# Patient Record
Sex: Female | Born: 1975 | Race: White | Hispanic: No | Marital: Married | Smoking: Former smoker
Health system: Southern US, Community
[De-identification: ages and names within clinical notes are randomized; demographics above are authoritative.]

## PROBLEM LIST (undated history)

## (undated) DIAGNOSIS — C801 Malignant (primary) neoplasm, unspecified: Secondary | ICD-10-CM

## (undated) HISTORY — PX: TONSILLECTOMY AND ADENOIDECTOMY: SHX28

---
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
## 2004-01-01 HISTORY — PX: ABDOMINAL HYSTERECTOMY: SHX81

## 2011-12-13 ENCOUNTER — Ambulatory Visit: Payer: Self-pay | Admitting: Family Medicine

## 2013-07-07 ENCOUNTER — Ambulatory Visit: Payer: Self-pay | Admitting: Family Medicine

## 2014-10-19 ENCOUNTER — Ambulatory Visit: Payer: Self-pay | Admitting: Family Medicine

## 2014-11-02 ENCOUNTER — Ambulatory Visit: Payer: Self-pay | Admitting: Family Medicine

## 2015-01-12 ENCOUNTER — Ambulatory Visit: Payer: Self-pay | Admitting: Family Medicine

## 2015-01-12 LAB — TSH: THYROID STIMULATING HORM: 5.5 u[IU]/mL — AB

## 2015-06-07 ENCOUNTER — Other Ambulatory Visit: Payer: Self-pay | Admitting: Family Medicine

## 2015-06-07 DIAGNOSIS — E032 Hypothyroidism due to medicaments and other exogenous substances: Secondary | ICD-10-CM

## 2015-06-15 DIAGNOSIS — E039 Hypothyroidism, unspecified: Secondary | ICD-10-CM | POA: Insufficient documentation

## 2015-06-15 DIAGNOSIS — I6350 Cerebral infarction due to unspecified occlusion or stenosis of unspecified cerebral artery: Secondary | ICD-10-CM | POA: Insufficient documentation

## 2015-06-15 DIAGNOSIS — C539 Malignant neoplasm of cervix uteri, unspecified: Secondary | ICD-10-CM | POA: Insufficient documentation

## 2015-06-15 DIAGNOSIS — G43909 Migraine, unspecified, not intractable, without status migrainosus: Secondary | ICD-10-CM | POA: Insufficient documentation

## 2015-07-10 ENCOUNTER — Other Ambulatory Visit: Payer: Self-pay | Admitting: Family Medicine

## 2015-07-11 ENCOUNTER — Other Ambulatory Visit: Payer: Self-pay | Admitting: Family Medicine

## 2015-08-26 ENCOUNTER — Ambulatory Visit: Payer: Self-pay | Admitting: Family Medicine

## 2015-08-30 ENCOUNTER — Ambulatory Visit (INDEPENDENT_AMBULATORY_CARE_PROVIDER_SITE_OTHER): Payer: BC Managed Care – PPO | Admitting: Family Medicine

## 2015-08-30 ENCOUNTER — Encounter: Payer: Self-pay | Admitting: Family Medicine

## 2015-08-30 ENCOUNTER — Other Ambulatory Visit: Payer: Self-pay

## 2015-08-30 VITALS — BP 122/76 | HR 94 | Temp 98.5°F | Resp 16 | Wt 229.8 lb

## 2015-08-30 DIAGNOSIS — E039 Hypothyroidism, unspecified: Secondary | ICD-10-CM

## 2015-08-30 NOTE — Progress Notes (Signed)
Patient ID: Linda Rogers, female   DOB: 05/12/1976, 39 y.o.   MRN: [id number]  Chief Complaint  Patient presents with  . Follow-up    6 month follow up hypothyroidism   Subjective:  HPI  Hypothyroidism:  Still on the Levothyroxine 50 mcg qd. Denies heat or cold intolerance, palpitations, constipation, diarrhea, weight gain or hair loss. Feels she has had some puffiness or tightness sensation in low legs. Extra puffiness with walking or standing a lot.   Family History  Problem Relation Age of Onset  . Ovarian cancer Mother   . Colon polyps Mother    Past Surgical History  Procedure Laterality Date  . Abdominal hysterectomy  2005  . Appendectomy  1987  . Tonsillectomy and adenoidectomy      Prior to Admission medications   Medication Sig Start Date End Date Taking? Authorizing Provider  ibuprofen (ADVIL,MOTRIN) 200 MG tablet Take by mouth as needed.   Yes Historical Provider, MD  levothyroxine (SYNTHROID, LEVOTHROID) 50 MCG tablet TAKE 1 TABLET BY MOUTH EVERY DAY 07/11/15  Yes Margo Common, PA    Patient Active Problem List   Diagnosis Date Noted  . Malignant neoplasm of cervix uteri 06/15/2015  . Cerebral artery occlusion with cerebral infarction 06/15/2015  . Adult hypothyroidism 06/15/2015  . Migraine 06/15/2015   History reviewed. No pertinent past medical history.  Social History   Social History  . Marital Status: Single    Spouse Name: N/A  . Number of Children: N/A  . Years of Education: N/A   Occupational History  . Not on file.   Social History Main Topics  . Smoking status: Former Smoker    Quit date: 11/16/2014  . Smokeless tobacco: Not on file  . Alcohol Use: No  . Drug Use: No  . Sexual Activity: Not on file   Other Topics Concern  . Not on file   Social History Narrative   No Known Allergies  Review of Systems  Constitutional: Negative.   HENT: Negative.   Eyes: Negative.   Respiratory: Negative.   Cardiovascular: Negative.    Gastrointestinal: Negative.   Genitourinary: Negative.   Musculoskeletal: Negative.   Skin: Negative.   Neurological: Negative.      There is no immunization history on file for this patient. Objective:  BP 122/76 mmHg  Pulse 94  Temp(Src) 98.5 F (36.9 C) (Oral)  Resp 16  Wt 229 lb 12.8 oz (104.237 kg)  SpO2 98%  Physical Exam  Constitutional: She is well-developed, well-nourished, and in no distress.  HENT:  Head: Normocephalic and atraumatic.  Eyes: Conjunctivae and EOM are normal.  Neck: Normal range of motion. Neck supple. No thyromegaly present.  Cardiovascular: Normal rate, regular rhythm and normal heart sounds.   Pulmonary/Chest: Effort normal and breath sounds normal.  Abdominal: Soft. Bowel sounds are normal.  Musculoskeletal: Normal range of motion.  Neurological: She is alert.  Skin: Skin is warm and dry.  Psychiatric: Affect and judgment normal.   Assessment and Plan :  1. Hypothyroidism, unspecified hypothyroidism type Some puffiness in lower legs without pitting. Tolerating Levothyroxine without weight gain or constipation. Will recheck labs and follow up pending lab reports. - TSH - T4 - CBC with Differential/Platelet - COMPLETE METABOLIC PANEL WITH GFR   [facility] 08/30/2015 3:54 PM

## 2015-09-08 ENCOUNTER — Other Ambulatory Visit
Admission: RE | Admit: 2015-09-08 | Discharge: 2015-09-08 | Disposition: A | Discharge status: Home / Self Care | Payer: BC Managed Care – PPO | Source: Ambulatory Visit | Attending: Family Medicine | Admitting: Family Medicine

## 2015-09-08 DIAGNOSIS — E039 Hypothyroidism, unspecified: Secondary | ICD-10-CM | POA: Diagnosis present

## 2015-09-08 LAB — CBC WITH DIFFERENTIAL/PLATELET
BASOS PCT: 1 %
Basophils Absolute: 0.1 10*3/uL (ref 0–0.1)
EOS ABS: 0.1 10*3/uL (ref 0–0.7)
Eosinophils Relative: 1 %
HEMATOCRIT: 39.1 % (ref 35.0–47.0)
HEMOGLOBIN: 13.2 g/dL (ref 12.0–16.0)
LYMPHS ABS: 3.3 10*3/uL (ref 1.0–3.6)
Lymphocytes Relative: 34 %
MCH: 31.9 pg (ref 26.0–34.0)
MCHC: 33.6 g/dL (ref 32.0–36.0)
MCV: 94.9 fL (ref 80.0–100.0)
MONO ABS: 0.4 10*3/uL (ref 0.2–0.9)
MONOS PCT: 4 %
NEUTROS PCT: 60 %
Neutro Abs: 5.8 10*3/uL (ref 1.4–6.5)
Platelets: 172 10*3/uL (ref 150–440)
RBC: 4.12 MIL/uL (ref 3.80–5.20)
RDW: 14.2 % (ref 11.5–14.5)
WBC: 9.7 10*3/uL (ref 3.6–11.0)

## 2015-09-08 LAB — COMPREHENSIVE METABOLIC PANEL
ALBUMIN: 4.2 g/dL (ref 3.5–5.0)
ALK PHOS: 38 U/L (ref 38–126)
ALT: 10 U/L — AB (ref 14–54)
ANION GAP: 8 (ref 5–15)
AST: 13 U/L — AB (ref 15–41)
BILIRUBIN TOTAL: 0.5 mg/dL (ref 0.3–1.2)
BUN: 16 mg/dL (ref 6–20)
CALCIUM: 9.2 mg/dL (ref 8.9–10.3)
CO2: 26 mmol/L (ref 22–32)
Chloride: 104 mmol/L (ref 101–111)
Creatinine, Ser: 0.7 mg/dL (ref 0.44–1.00)
GFR calc Af Amer: >60 mL/min (ref >60)
GFR calc non Af Amer: >60 mL/min (ref >60)
GLUCOSE: 97 mg/dL (ref 65–99)
Potassium: 4 mmol/L (ref 3.5–5.1)
SODIUM: 138 mmol/L (ref 135–145)
TOTAL PROTEIN: 7.1 g/dL (ref 6.5–8.1)

## 2015-09-08 LAB — TSH: TSH: 3.697 u[IU]/mL (ref 0.350–4.500)

## 2015-09-09 ENCOUNTER — Telehealth: Payer: Self-pay

## 2015-09-09 NOTE — Telephone Encounter (Signed)
Patient advised as directed below. Patient verbalized understanding.  

## 2015-09-09 NOTE — Telephone Encounter (Signed)
-----   Message from Margo Common, [place] sent at 09/09/2015 12:25 PM EDT ----- All blood tests essentially normal. Awaiting final report of thyroid tests.

## 2015-09-10 LAB — T4: T4, Total: 7.5 ug/dL (ref 4.5–12.0)

## 2015-09-13 ENCOUNTER — Telehealth: Payer: Self-pay

## 2015-09-13 NOTE — Telephone Encounter (Signed)
-----   Message from Margo Common, [place] sent at 09/12/2015  4:56 PM EDT ----- Final report of thyroid function is normal. Continue present Levothyroxine 50 mcg qd and recheck in 6 months.

## 2015-09-13 NOTE — Telephone Encounter (Signed)
Left message (consent in chart) advising patient labs were normal.

## 2015-09-23 ENCOUNTER — Encounter: Payer: Self-pay | Admitting: Family Medicine

## 2015-10-31 ENCOUNTER — Telehealth: Payer: Self-pay | Admitting: Family Medicine

## 2015-10-31 NOTE — Telephone Encounter (Signed)
Pt call for refill on levothyroxine (SYNTHROID, LEVOTHROID) 50 MCG tablet Taking 07/11/15 -  She uses [facility] in [place]   Her call back is [phone]  Thanks  Con Memos

## 2015-11-01 ENCOUNTER — Other Ambulatory Visit: Payer: Self-pay | Admitting: Family Medicine

## 2015-11-01 ENCOUNTER — Telehealth: Payer: Self-pay

## 2015-11-01 MED ORDER — LEVOTHYROXINE SODIUM 50 MCG PO TABS: 50.0000 ug | ORAL_TABLET | Freq: Every day | ORAL | Status: DC

## 2015-11-01 NOTE — Telephone Encounter (Signed)
Refill request received from [facility] requesting Levothyroxine 50 mcg.

## 2015-11-03 NOTE — Telephone Encounter (Signed)
Dennis sent RX to pharmacy.  

## 2016-01-26 ENCOUNTER — Other Ambulatory Visit: Payer: Self-pay | Admitting: Family Medicine

## 2016-06-06 ENCOUNTER — Other Ambulatory Visit: Payer: Self-pay | Admitting: Family Medicine

## 2016-06-06 NOTE — Telephone Encounter (Signed)
Pt wants to know if she can have 90 days RX instead of 30. levothyroxine (SYNTHROID, LEVOTHROID) 50 MCG tablet  Thank sTeri  [facility] [place]

## 2016-07-18 ENCOUNTER — Other Ambulatory Visit: Payer: Self-pay | Admitting: Family Medicine

## 2016-07-19 NOTE — Telephone Encounter (Signed)
Advise patient she is due for follow up appointment and this will be the last refill until accomplished. Last time seen was a year ago.

## 2016-07-20 NOTE — Telephone Encounter (Signed)
Pt advised on voicemail-aa 

## 2016-07-26 ENCOUNTER — Ambulatory Visit (INDEPENDENT_AMBULATORY_CARE_PROVIDER_SITE_OTHER): Payer: BC Managed Care – PPO | Admitting: Family Medicine

## 2016-07-26 ENCOUNTER — Encounter: Payer: Self-pay | Admitting: Family Medicine

## 2016-07-26 VITALS — BP 112/70 | HR 76 | Temp 98.4°F | Resp 16 | Wt 232.0 lb

## 2016-07-26 DIAGNOSIS — E039 Hypothyroidism, unspecified: Secondary | ICD-10-CM

## 2016-07-26 DIAGNOSIS — R5381 Other malaise: Secondary | ICD-10-CM

## 2016-07-26 NOTE — Progress Notes (Signed)
Patient: Linda Rogers Female    DOB: [birth year]   40 y.o.   MRN: [id number] Visit Date: 07/26/2016  Today's Provider: Vernie Murders, PA   Chief Complaint  Patient presents with  . Follow-up   Subjective:    HPI  Hypothyroidism, unspecified hypothyroidism type: From 08/30/15-Some puffiness in lower legs without pitting. Tolerating Levothyroxine without weight gain or constipation. Will recheck labs and follow up pending lab reports. History reviewed. No pertinent past medical history. Patient Active Problem List   Diagnosis Date Noted  . Malignant neoplasm of cervix uteri ([place]) 06/15/2015  . Cerebral artery occlusion with cerebral infarction ([place]) 06/15/2015  . Adult hypothyroidism 06/15/2015  . Migraine 06/15/2015   Past Surgical History:  Procedure Laterality Date  . ABDOMINAL HYSTERECTOMY  2005  . APPENDECTOMY  1987  . TONSILLECTOMY AND ADENOIDECTOMY     Family History  Problem Relation Age of Onset  . Ovarian cancer Mother   . Colon polyps Mother    No Known Allergies   Current Meds  Medication Sig  . ibuprofen (ADVIL,MOTRIN) 200 MG tablet Take by mouth as needed.  Marland Kitchen levothyroxine (SYNTHROID, LEVOTHROID) 50 MCG tablet TAKE 1 TABLET BY MOUTH DAILY    Review of Systems  Constitutional: Negative for appetite change, chills, fatigue and fever.  Respiratory: Negative for chest tightness and shortness of breath.   Cardiovascular: Negative for chest pain and palpitations.  Gastrointestinal: Negative for abdominal pain, nausea and vomiting.  Neurological: Negative for dizziness and weakness.    Social History  Substance Use Topics  . Smoking status: Former Smoker    Quit date: 11/16/2014  . Smokeless tobacco: Not on file  . Alcohol use No   Objective:   BP 112/70 (BP Location: Left Arm, Patient Position: Sitting, Cuff Size: Large)   Pulse 76   Temp 98.4 F (36.9 C) (Oral)   Resp 16   Wt 232 lb (105.2 kg)   SpO2 98%   BMI 32.36 kg/m  Wt  Readings from Last 3 Encounters:  07/26/16 232 lb (105.2 kg)  08/30/15 229 lb 12.8 oz (104.2 kg)  02/25/15 230 lb 3.2 oz (104.4 kg)    Physical Exam  Constitutional: She is oriented to person, place, and time. She appears well-developed and well-nourished. No distress.  HENT:  Head: Normocephalic and atraumatic.  Right Ear: Hearing and external ear normal.  Left Ear: Hearing and external ear normal.  Nose: Nose normal.  Eyes: Conjunctivae and lids are normal. Right eye exhibits no discharge. Left eye exhibits no discharge. No scleral icterus.  Neck: Neck supple. No thyromegaly present.  Cardiovascular: Normal rate and regular rhythm.   Pulmonary/Chest: Effort normal and breath sounds normal. No respiratory distress.  Abdominal: Soft. Bowel sounds are normal.  Musculoskeletal: Normal range of motion.  Lymphadenopathy:    She has no cervical adenopathy.  Neurological: She is alert and oriented to person, place, and time. She has normal reflexes. Coordination normal.  Skin: Skin is intact. No lesion noted.  Scratches and some bruising left lower leg and knee from a recent fall. No sign of infection.  Psychiatric: She has a normal mood and affect. Her speech is normal and behavior is normal. Thought content normal.      Assessment & Plan:     1. Hypothyroidism, unspecified hypothyroidism type Has been taking Levothyroxine 50 mcg qd regularly. Denies diarrhea, constipation, palpitations, depression, tremor, edema or dry skin. Continue present dosage and recheck labs. Follow up pending reports. -  CBC with Differential/Platelet - Comprehensive metabolic panel - T4 - TSH  2. Malaise Generalized malaise with decreased energy level. Will check labs. - CBC with Differential/Platelet - Comprehensive metabolic panel - T4 - TSH       Vernie Murders, PA  [facility]

## 2016-07-26 NOTE — Patient Instructions (Signed)

## 2016-07-30 ENCOUNTER — Other Ambulatory Visit
Admission: RE | Admit: 2016-07-30 | Discharge: 2016-07-30 | Disposition: A | Discharge status: Home / Self Care | Payer: BC Managed Care – PPO | Source: Ambulatory Visit | Attending: Family Medicine | Admitting: Family Medicine

## 2016-07-30 DIAGNOSIS — E039 Hypothyroidism, unspecified: Secondary | ICD-10-CM | POA: Diagnosis present

## 2016-07-30 DIAGNOSIS — R5381 Other malaise: Secondary | ICD-10-CM | POA: Diagnosis present

## 2016-07-30 LAB — COMPREHENSIVE METABOLIC PANEL
ALBUMIN: 4.4 g/dL (ref 3.5–5.0)
ALT: 11 U/L — ABNORMAL LOW (ref 14–54)
AST: 13 U/L — AB (ref 15–41)
Alkaline Phosphatase: 40 U/L (ref 38–126)
Anion gap: 5 (ref 5–15)
BUN: 13 mg/dL (ref 6–20)
CHLORIDE: 104 mmol/L (ref 101–111)
CO2: 27 mmol/L (ref 22–32)
CREATININE: 0.71 mg/dL (ref 0.44–1.00)
Calcium: 9 mg/dL (ref 8.9–10.3)
GFR calc Af Amer: >60 mL/min (ref >60)
GFR calc non Af Amer: >60 mL/min (ref >60)
Glucose, Bld: 72 mg/dL (ref 65–99)
POTASSIUM: 3.9 mmol/L (ref 3.5–5.1)
SODIUM: 136 mmol/L (ref 135–145)
Total Bilirubin: 0.4 mg/dL (ref 0.3–1.2)
Total Protein: 7.2 g/dL (ref 6.5–8.1)

## 2016-07-30 LAB — CBC WITH DIFFERENTIAL/PLATELET
BASOS ABS: 0.1 10*3/uL (ref 0–0.1)
BASOS PCT: 1 %
EOS ABS: 0.2 10*3/uL (ref 0–0.7)
Eosinophils Relative: 2 %
HCT: 39.6 % (ref 35.0–47.0)
HEMOGLOBIN: 13.2 g/dL (ref 12.0–16.0)
LYMPHS ABS: 3.8 10*3/uL — AB (ref 1.0–3.6)
Lymphocytes Relative: 41 %
MCH: 31.7 pg (ref 26.0–34.0)
MCHC: 33.4 g/dL (ref 32.0–36.0)
MCV: 94.7 fL (ref 80.0–100.0)
Monocytes Absolute: 0.5 10*3/uL (ref 0.2–0.9)
Monocytes Relative: 5 %
NEUTROS PCT: 51 %
Neutro Abs: 4.5 10*3/uL (ref 1.4–6.5)
Platelets: 180 10*3/uL (ref 150–440)
RBC: 4.18 MIL/uL (ref 3.80–5.20)
RDW: 14 % (ref 11.5–14.5)
WBC: 9.2 10*3/uL (ref 3.6–11.0)

## 2016-07-30 LAB — TSH: TSH: 6.086 u[IU]/mL — AB (ref 0.350–4.500)

## 2016-08-01 LAB — T4: T4, Total: 6.5 ug/dL (ref 4.5–12.0)

## 2016-08-03 ENCOUNTER — Telehealth: Payer: Self-pay

## 2016-08-03 MED ORDER — LEVOTHYROXINE SODIUM 75 MCG PO TABS: 75.0000 ug | ORAL_TABLET | Freq: Every day | ORAL | 3 refills | Status: DC

## 2016-08-03 NOTE — Telephone Encounter (Signed)
Pt advised.  RX for the increased Levothyroxine sent to [facility] in [place].  Thanks,   -Mickel Baas

## 2016-08-03 NOTE — Telephone Encounter (Signed)
  LMTCB 08/03/2016  Thanks,   -Daliah Chaudoin  

## 2016-08-03 NOTE — Telephone Encounter (Signed)
-----   Message from Margo Common, [place] sent at 07/31/2016  8:06 AM EDT ----- Blood cell counts and chemistry normal. Awaiting final report of thyroid tests. Preliminary TSH report indicates hypothyroidism and may need adjustment of Levothyroxine.

## 2016-08-22 ENCOUNTER — Telehealth: Payer: Self-pay | Admitting: Family Medicine

## 2016-08-22 NOTE — Telephone Encounter (Signed)
Pt stated that she is going on a cruise in September and would like an RX for motion sickness patches sent to [facility]. Pt stated she has used them in the pasted but couldn't remember the name of the patches. Pt was advised that Simona Huh is out of the office this week. Pt stated that this could wait until his return. Please advise. Thanks TNP

## 2016-08-27 ENCOUNTER — Encounter: Payer: Self-pay | Admitting: Family Medicine

## 2016-08-27 MED ORDER — SCOPOLAMINE 1 MG/3DAYS TD PT72: 1.0000 | MEDICATED_PATCH | TRANSDERMAL | 0 refills | Status: DC

## 2016-08-27 NOTE — Telephone Encounter (Signed)
Sent Transderm-Scop patches to the [facility] in [place]. Remember the patch effect may take up to 2 hours to start. Change the patch every 3 days with a new one if nausea recurs. Don't have to use it if no seasickness. Also, the patch can cause dizziness and sleepiness.

## 2016-11-26 ENCOUNTER — Encounter: Payer: Self-pay | Admitting: Family Medicine

## 2016-11-26 ENCOUNTER — Ambulatory Visit (INDEPENDENT_AMBULATORY_CARE_PROVIDER_SITE_OTHER): Payer: BC Managed Care – PPO | Admitting: Family Medicine

## 2016-11-26 VITALS — BP 118/82 | HR 92 | Temp 98.5°F | Resp 16 | Wt 243.8 lb

## 2016-11-26 DIAGNOSIS — K143 Hypertrophy of tongue papillae: Secondary | ICD-10-CM

## 2016-11-26 MED ORDER — CLOTRIMAZOLE 10 MG MT TROC: 10.0000 mg | Freq: Every day | OROMUCOSAL | 0 refills | Status: DC

## 2016-11-26 NOTE — Progress Notes (Signed)
   Patient: Linda Rogers Female    DOB: 12/30/1976   40 y.o.   MRN: [id number] Visit Date: 11/26/2016  Today's Provider: Vernie Murders, PA   Chief Complaint  Patient presents with  . Thrush   Subjective:    HPI Thrush:  This is a new problem. Episode onset: Friday. The problem occurs constantly. The problem has been unchanged. Associated symptoms comments: Burning sensation on tongue and white film on tongue. Nothing aggravates the symptoms. She has tried nothing for the symptoms.  Patient reports her 10 month old grandson has thrush.   Patient Active Problem List   Diagnosis Date Noted  . Malignant neoplasm of cervix uteri ([place]) 06/15/2015  . Cerebral artery occlusion with cerebral infarction ([place]) 06/15/2015  . Adult hypothyroidism 06/15/2015  . Migraine 06/15/2015   Past Surgical History:  Procedure Laterality Date  . ABDOMINAL HYSTERECTOMY  2005  . APPENDECTOMY  1987  . TONSILLECTOMY AND ADENOIDECTOMY     Family History  Problem Relation Age of Onset  . Ovarian cancer Mother   . Colon polyps Mother    No Known Allergies    Previous Medications   IBUPROFEN (ADVIL,MOTRIN) 200 MG TABLET    Take by mouth as needed.   LEVOTHYROXINE (SYNTHROID, LEVOTHROID) 75 MCG TABLET    Take 1 tablet (75 mcg total) by mouth daily.    Review of Systems  Constitutional: Negative.   HENT:       White coating on tongue   Respiratory: Negative.   Cardiovascular: Negative.     Social History  Substance Use Topics  . Smoking status: Former Smoker    Quit date: 11/16/2014  . Smokeless tobacco: Not on file  . Alcohol use No   Objective:   BP 118/82 (BP Location: Right Arm, Patient Position: Sitting, Cuff Size: Normal)   Pulse 92   Temp 98.5 F (36.9 C) (Oral)   Resp 16   Wt 243 lb 12.8 oz (110.6 kg)   BMI 34.00 kg/m   Physical Exam  Constitutional: She is oriented to person, place, and time. She appears well-developed and well-nourished. No distress.  HENT:    Head: Normocephalic and atraumatic.  Right Ear: Hearing normal.  Left Ear: Hearing normal.  Nose: Nose normal.  White coating to tongue with some burning sensation. Posterior pharynx clean and pink.  Eyes: Conjunctivae and lids are normal. Right eye exhibits no discharge. Left eye exhibits no discharge. No scleral icterus.  Pulmonary/Chest: Effort normal. No respiratory distress.  Musculoskeletal: Normal range of motion.  Neurological: She is alert and oriented to person, place, and time.  Skin: Skin is intact. No lesion and no rash noted.  Psychiatric: She has a normal mood and affect. Her speech is normal and behavior is normal. Thought content normal.      Assessment & Plan:     1. Coated tongue Onset 3 days ago after kissing on her 53 month old grandson who has oral thrush. Will treat with Mycelex Troches 5 times a day and gargle with warm saltwater at bedtime. - clotrimazole (MYCELEX) 10 MG troche; Take 1 tablet (10 mg total) by mouth 5 (five) times daily.  Dispense: 20 tablet; Refill: 0

## 2016-11-26 NOTE — Patient Instructions (Signed)
Oral Thrush, Adult Oral thrush, also called oral candidiasis, is a fungal infection that develops in the mouth and throat and on the tongue. It causes white patches to form on the mouth and tongue. Thrush is most common in older adults, but it can occur at any age. Many cases of thrush are mild, but this infection can also be serious. Thrush can be a repeated (recurrent) problem for certain people who have a weak body defense system (immune system). The weakness can be caused by chronic illnesses, or by taking medicines that limit the body's ability to fight infection. If a person has difficulty fighting infection, the fungus that causes thrush can spread through the body. This can cause life-threatening blood or organ infections. What are the causes? This condition is caused by a fungus (yeast) called Candida albicans.  This fungus is normally present in small amounts in the mouth and on other mucous membranes. It usually causes no harm.  If conditions are present that allow the fungus to grow without control, it invades surrounding tissues and becomes an infection.  Other Candida species can also lead to thrush (rare).  What increases the risk? This condition is more likely to develop in:  People with a weakened immune system.  Older adults.  People with HIV (human immunodeficiency virus).  People with diabetes.  People with dry mouth (xerostomia).  Pregnant women.  People with poor dental care, especially people who have false teeth.  People who use antibiotic medicines.  What are the signs or symptoms? Symptoms of this condition can vary from mild and moderate to severe and persistent. Symptoms may include:  A burning feeling in the mouth and throat. This can occur at the start of a thrush infection.  White patches that stick to the mouth and tongue. The tissue around the patches may be red, raw, and painful. If rubbed (during tooth brushing, for example), the patches and the  tissue of the mouth may bleed easily.  A bad taste in the mouth or difficulty tasting foods.  A cottony feeling in the mouth.  Pain during eating and swallowing.  Poor appetite.  Cracking at the corners of the mouth.  How is this diagnosed? This condition is diagnosed based on:  Physical exam. Your health care provider will look in your mouth.  Health history. Your health care provider will ask you questions about your health.  How is this treated? This condition is treated with medicines called antifungals, which prevent the growth of fungi. These medicines are either applied directly to the affected area (topical) or swallowed (oral). The treatment will depend on the severity of the condition. Mild thrush Mild cases of thrush may clear up with the use of an antifungal mouth rinse or lozenges. Treatment usually lasts about 14 days. Moderate to severe thrush  More severe thrush infections that have spread to the esophagus are treated with an oral antifungal medicine. A topical antifungal medicine may also be used.  For some severe infections, treatment may need to continue for more than 14 days.  Oral antifungal medicines are rarely used during pregnancy because they may be harmful to the unborn child. If you are pregnant, talk with your health care provider about options for treatment. Persistent or recurrent thrush For cases of thrush that do not go away or keep coming back:  Treatment may be needed twice as long as the symptoms last.  Treatment will include both oral and topical antifungal medicines.  People with a weakened immune   system can take an antifungal medicine on a continuous basis to prevent thrush infections.  It is important to treat conditions that make a person more likely to get thrush, such as diabetes or HIV. Follow these instructions at home: Medicines  Take over-the-counter and prescription medicines only as told by your health care provider.  Talk  with your health care provider about an over-the-counter medicine called gentian violet, which kills bacteria and fungi. Relieving soreness and discomfort To help reduce the discomfort of thrush:  Drink cold liquids such as water or iced tea.  Try flavored ice treats or frozen juices.  Eat foods that are easy to swallow, such as gelatin, ice cream, or custard.  Try drinking from a straw if the patches in your mouth are painful.  General instructions  Eat plain, unflavored yogurt as directed by your health care provider. Check the label to make sure the yogurt contains live cultures. This yogurt can help healthy bacteria to grow in the mouth and can stop the growth of the fungus that causes thrush.  If you wear dentures, remove the dentures before going to bed, brush them vigorously, and soak them in a cleaning solution as directed by your health care provider.  Rinse your mouth with a warm salt-water mixture several times a day. To make a salt-water mixture, completely dissolve 1/2-1 tsp of salt in 1 cup of warm water. Contact a health care provider if:  Your symptoms are getting worse or are not improving within 7 days of starting treatment.  You have symptoms of a spreading infection, such as white patches on the skin outside of the mouth. This information is not intended to replace advice given to you by your health care provider. Make sure you discuss any questions you have with your health care provider. Document Released: 09/11/2004 Document Revised: 09/10/2016 Document Reviewed: 09/10/2016 Elsevier Interactive Patient Education  2017 [employer]  

## 2016-12-16 ENCOUNTER — Other Ambulatory Visit: Payer: Self-pay | Admitting: Family Medicine

## 2017-01-09 ENCOUNTER — Other Ambulatory Visit: Payer: Self-pay | Admitting: Family Medicine

## 2017-02-18 ENCOUNTER — Ambulatory Visit (INDEPENDENT_AMBULATORY_CARE_PROVIDER_SITE_OTHER): Payer: BC Managed Care – PPO | Admitting: Family Medicine

## 2017-02-18 ENCOUNTER — Encounter: Payer: Self-pay | Admitting: Family Medicine

## 2017-02-18 ENCOUNTER — Ambulatory Visit
Admission: RE | Admit: 2017-02-18 | Discharge: 2017-02-18 | Disposition: A | Discharge status: Home / Self Care | Payer: BC Managed Care – PPO | Source: Ambulatory Visit | Attending: Family Medicine | Admitting: Family Medicine

## 2017-02-18 VITALS — BP 138/98 | HR 80 | Temp 98.4°F | Resp 16 | Wt 249.2 lb

## 2017-02-18 DIAGNOSIS — M47896 Other spondylosis, lumbar region: Secondary | ICD-10-CM | POA: Diagnosis not present

## 2017-02-18 DIAGNOSIS — M5440 Lumbago with sciatica, unspecified side: Secondary | ICD-10-CM | POA: Insufficient documentation

## 2017-02-18 DIAGNOSIS — M545 Low back pain, unspecified: Secondary | ICD-10-CM

## 2017-02-18 MED ORDER — CYCLOBENZAPRINE HCL 5 MG PO TABS
5.0000 mg | ORAL_TABLET | Freq: Three times a day (TID) | ORAL | 1 refills | Status: DC | PRN
Start: 2017-02-18 — End: 2017-03-07

## 2017-02-18 MED ORDER — PREDNISONE 10 MG PO TABS: 10.0000 mg | ORAL_TABLET | Freq: Every day | ORAL | 0 refills | Status: DC

## 2017-02-18 NOTE — Patient Instructions (Signed)
Back Exercises Introduction If you have pain in your back, do these exercises 2-3 times each day or as told by your doctor. When the pain goes away, do the exercises once each day, but repeat the steps more times for each exercise (do more repetitions). If you do not have pain in your back, do these exercises once each day or as told by your doctor. Exercises Single Knee to Chest  Do these steps 3-5 times in a row for each leg: 1. Lie on your back on a firm bed or the floor with your legs stretched out. 2. Bring one knee to your chest. 3. Hold your knee to your chest by grabbing your knee or thigh. 4. Pull on your knee until you feel a gentle stretch in your lower back. 5. Keep doing the stretch for 10-30 seconds. 6. Slowly let go of your leg and straighten it. Pelvic Tilt  Do these steps 5-10 times in a row: 1. Lie on your back on a firm bed or the floor with your legs stretched out. 2. Bend your knees so they point up to the ceiling. Your feet should be flat on the floor. 3. Tighten your lower belly (abdomen) muscles to press your lower back against the floor. This will make your tailbone point up to the ceiling instead of pointing down to your feet or the floor. 4. Stay in this position for 5-10 seconds while you gently tighten your muscles and breathe evenly. Cat-Cow  Do these steps until your lower back bends more easily: 1. Get on your hands and knees on a firm surface. Keep your hands under your shoulders, and keep your knees under your hips. You may put padding under your knees. 2. Let your head hang down, and make your tailbone point down to the floor so your lower back is round like the back of a cat. 3. Stay in this position for 5 seconds. 4. Slowly lift your head and make your tailbone point up to the ceiling so your back hangs low (sags) like the back of a cow. 5. Stay in this position for 5 seconds. Press-Ups  Do these steps 5-10 times in a row: 1. Lie on your belly  (face-down) on the floor. 2. Place your hands near your head, about shoulder-width apart. 3. While you keep your back relaxed and keep your hips on the floor, slowly straighten your arms to raise the top half of your body and lift your shoulders. Do not use your back muscles. To make yourself more comfortable, you may change where you place your hands. 4. Stay in this position for 5 seconds. 5. Slowly return to lying flat on the floor. Bridges  Do these steps 10 times in a row: 1. Lie on your back on a firm surface. 2. Bend your knees so they point up to the ceiling. Your feet should be flat on the floor. 3. Tighten your butt muscles and lift your butt off of the floor until your waist is almost as high as your knees. If you do not feel the muscles working in your butt and the back of your thighs, slide your feet 1-2 inches farther away from your butt. 4. Stay in this position for 3-5 seconds. 5. Slowly lower your butt to the floor, and let your butt muscles relax. If this exercise is too easy, try doing it with your arms crossed over your chest. Belly Crunches  Do these steps 5-10 times in a row: 1. Lie   on your back on a firm bed or the floor with your legs stretched out. 2. Bend your knees so they point up to the ceiling. Your feet should be flat on the floor. 3. Cross your arms over your chest. 4. Tip your chin a little bit toward your chest but do not bend your neck. 5. Tighten your belly muscles and slowly raise your chest just enough to lift your shoulder blades a tiny bit off of the floor. 6. Slowly lower your chest and your head to the floor. Back Lifts  Do these steps 5-10 times in a row: 1. Lie on your belly (face-down) with your arms at your sides, and rest your forehead on the floor. 2. Tighten the muscles in your legs and your butt. 3. Slowly lift your chest off of the floor while you keep your hips on the floor. Keep the back of your head in line with the curve in your back.  Look at the floor while you do this. 4. Stay in this position for 3-5 seconds. 5. Slowly lower your chest and your face to the floor. Contact a doctor if:  Your back pain gets a lot worse when you do an exercise.  Your back pain does not lessen 2 hours after you exercise. If you have any of these problems, stop doing the exercises. Do not do them again unless your doctor says it is okay. Get help right away if:  You have sudden, very bad back pain. If this happens, stop doing the exercises. Do not do them again unless your doctor says it is okay. This information is not intended to replace advice given to you by your health care provider. Make sure you discuss any questions you have with your health care provider. Document Released: 01/19/2011 Document Revised: 05/24/2016 Document Reviewed: 02/10/2015  2017 Elsevier Back Pain, Adult Back pain is very common in adults.The cause of back pain is rarely dangerous and the pain often gets better over time.The cause of your back pain may not be known. Some common causes of back pain include:  Strain of the muscles or ligaments supporting the spine.  Wear and tear (degeneration) of the spinal disks.  Arthritis.  Direct injury to the back. For many people, back pain may return. Since back pain is rarely dangerous, most people can learn to manage this condition on their own. Follow these instructions at home: Watch your back pain for any changes. The following actions may help to lessen any discomfort you are feeling:  Remain active. It is stressful on your back to sit or stand in one place for long periods of time. Do not sit, drive, or stand in one place for more than 30 minutes at a time. Take short walks on even surfaces as soon as you are able.Try to increase the length of time you walk each day.  Exercise regularly as directed by your health care provider. Exercise helps your back heal faster. It also helps avoid future injury by  keeping your muscles strong and flexible.  Do not stay in bed.Resting more than 1-2 days can delay your recovery.  Pay attention to your body when you bend and lift. The most comfortable positions are those that put less stress on your recovering back. Always use proper lifting techniques, including:  Bending your knees.  Keeping the load close to your body.  Avoiding twisting.  Find a comfortable position to sleep. Use a firm mattress and lie on your side with your  knees slightly bent. If you lie on your back, put a pillow under your knees.  Avoid feeling anxious or stressed.Stress increases muscle tension and can worsen back pain.It is important to recognize when you are anxious or stressed and learn ways to manage it, such as with exercise.  Take medicines only as directed by your health care provider. Over-the-counter medicines to reduce pain and inflammation are often the most helpful.Your health care provider may prescribe muscle relaxant drugs.These medicines help dull your pain so you can more quickly return to your normal activities and healthy exercise.  Apply ice to the injured area:  Put ice in a plastic bag.  Place a towel between your skin and the bag.  Leave the ice on for 20 minutes, 2-3 times a day for the first 2-3 days. After that, ice and heat may be alternated to reduce pain and spasms.  Maintain a healthy weight. Excess weight puts extra stress on your back and makes it difficult to maintain good posture. Contact a health care provider if:  You have pain that is not relieved with rest or medicine.  You have increasing pain going down into the legs or buttocks.  You have pain that does not improve in one week.  You have night pain.  You lose weight.  You have a fever or chills. Get help right away if:  You develop new bowel or bladder control problems.  You have unusual weakness or numbness in your arms or legs.  You develop nausea or  vomiting.  You develop abdominal pain.  You feel faint. This information is not intended to replace advice given to you by your health care provider. Make sure you discuss any questions you have with your health care provider. Document Released: 12/17/2005 Document Revised: 04/26/2016 Document Reviewed: 04/20/2014 Elsevier Interactive Patient Education  2017 [employer].

## 2017-02-18 NOTE — Progress Notes (Signed)
Patient: Linda Rogers Female    DOB: [birth year]   41 y.o.   MRN: [id number] Visit Date: 02/18/2017  Today's Provider: Vernie Murders, PA   Chief Complaint  Patient presents with  . Back Pain   Subjective:    Back Pain  This is a new problem. The current episode started more than 1 month ago (Woke up with pain one morning in December 2017). The problem occurs constantly. The problem has been gradually worsening since onset. The pain is present in the lumbar spine (Left). The quality of the pain is described as shooting. The pain radiates to the left knee and left foot. She has tried analgesics (TENS unit, Tyleno, Aleve and Ibuprofen) for the symptoms. The treatment provided mild (Only from TENS unit - OTC) relief.   Patient Active Problem List   Diagnosis Date Noted  . 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 Adult hypothyroidism 06/15/2015  . Migraine 06/15/2015   Past Surgical History:  Procedure Laterality Date  . ABDOMINAL HYSTERECTOMY  2005  . APPENDECTOMY  1987  . TONSILLECTOMY AND ADENOIDECTOMY     Family History  Problem Relation Age of Onset  . Ovarian cancer Mother   . Colon polyps Mother    No Known Allergies   Previous Medications   CLOTRIMAZOLE (MYCELEX) 10 MG TROCHE    Take 1 tablet (10 mg total) by mouth 5 (five) times daily.   IBUPROFEN (ADVIL,MOTRIN) 200 MG TABLET    Take by mouth as needed.   LEVOTHYROXINE (SYNTHROID, LEVOTHROID) 75 MCG TABLET    TAKE 1 TABLET BY MOUTH DAILY    Review of Systems  Constitutional: Negative.   Respiratory: Negative.   Cardiovascular: Negative.   Musculoskeletal: Positive for back pain.    Social History  Substance Use Topics  . Smoking status: Former Smoker    Quit date: 11/16/2014  . Smokeless tobacco: Never Used  . Alcohol use No   Objective:   BP (!) 138/98 (BP Location: Right Arm, Patient Position: Sitting, Cuff  Size: Normal)   Pulse 80   Temp 98.4 F (36.9 C) (Oral)   Resp 16   Wt 249 lb 3.2 oz (113 kg)   SpO2 99%   BMI 34.76 kg/m   Physical Exam  Constitutional: She is oriented to person, place, and time. She appears well-developed and well-nourished. No distress.  HENT:  Head: Normocephalic and atraumatic.  Right Ear: Hearing normal.  Left Ear: Hearing normal.  Nose: Nose normal.  Eyes: Conjunctivae and lids are normal. Right eye exhibits no discharge. Left eye exhibits no discharge. No scleral icterus.  Pulmonary/Chest: Effort normal. No respiratory distress.  Musculoskeletal: Normal range of motion.  Tenderness in left and midline lower lumbar/sacral area to palpate. Unable to attempt SLR's due to acute spasm.  Neurological: She is alert and oriented to person, place, and time.  DTR's 1+ on right knee, and 2+ on the left.  Skin: Skin is intact. No lesion and no rash noted.  Psychiatric: She has a normal mood and affect. Her speech is normal and behavior is normal. Thought content normal.      Assessment & Plan:     1. Low back pain with radiation Onset over the past couple months with worsening and no help from Ibuprofen and heat. OTC TENS unit affords some relief now but having radiation down the posterior thigh to the posterior left knee. Numbness occurs in the  left leg when elevating the leg in a recliner. Will get L-S spine x-ray, Flexeril and Prednisone taper. Out of work this week and recheck in one week. - cyclobenzaprine (FLEXERIL) 5 MG tablet; Take 1 tablet (5 mg total) by mouth 3 (three) times daily as needed for muscle spasms.  Dispense: 30 tablet; Refill: 1 - predniSONE (DELTASONE) 10 MG tablet; Take 1 tablet (10 mg total) by mouth daily with breakfast. Taper down by one tablet daily (6,5,4,3,2,1)  Dispense: 21 tablet; Refill: 0 - DG Lumbar Spine Complete

## 2017-02-25 ENCOUNTER — Ambulatory Visit (INDEPENDENT_AMBULATORY_CARE_PROVIDER_SITE_OTHER): Payer: BC Managed Care – PPO | Admitting: Family Medicine

## 2017-02-25 ENCOUNTER — Encounter: Payer: Self-pay | Admitting: Family Medicine

## 2017-02-25 VITALS — BP 110/82 | HR 77 | Temp 98.3°F | Resp 16 | Wt 249.6 lb

## 2017-02-25 DIAGNOSIS — Z1389 Encounter for screening for other disorder: Secondary | ICD-10-CM | POA: Diagnosis not present

## 2017-02-25 DIAGNOSIS — M5440 Lumbago with sciatica, unspecified side: Secondary | ICD-10-CM | POA: Diagnosis not present

## 2017-02-25 DIAGNOSIS — M545 Low back pain, unspecified: Secondary | ICD-10-CM

## 2017-02-25 DIAGNOSIS — E039 Hypothyroidism, unspecified: Secondary | ICD-10-CM | POA: Diagnosis not present

## 2017-02-25 DIAGNOSIS — Z1331 Encounter for screening for depression: Secondary | ICD-10-CM

## 2017-02-25 MED ORDER — LEVOTHYROXINE SODIUM 75 MCG PO TABS: 75.0000 ug | ORAL_TABLET | Freq: Every day | ORAL | 3 refills | Status: DC

## 2017-02-25 NOTE — Progress Notes (Addendum)
Patient: Linda Rogers Female    DOB: 03/19/1976   41 y.o.   MRN: [id number] Visit Date: 02/25/2017  Today's Provider: Vernie Murders, PA   Chief Complaint  Patient presents with  . Back Pain  . Follow-up   Subjective:    HPI Patient is here for a 1 week follow up for lower back pain with radiation. Last OV was 02/18/2017. She started Flexeril 5 mg and Prednisone 10 mg taper. A L-S spine x-ray was done that showed mild dengerative changes. Patient reports good compliance with treatment plan. She was only able to take Flexeril for 2 days because of drowsiness.  Symptoms have improved.  Patient Active Problem List   Diagnosis Date Noted  . 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 Cerebral artery occlusion with cerebral infarction ([place]) 06/15/2015  . Adult hypothyroidism 06/15/2015  . Migraine 06/15/2015   Past Surgical History:  Procedure Laterality Date  . ABDOMINAL HYSTERECTOMY  2005  . APPENDECTOMY  1987  . TONSILLECTOMY AND ADENOIDECTOMY     Family History  Problem Relation Age of Onset  . Ovarian cancer Mother   . Colon polyps Mother    No Known Allergies   Previous Medications   CLOTRIMAZOLE (MYCELEX) 10 MG TROCHE    Take 1 tablet (10 mg total) by mouth 5 (five) times daily.   CYCLOBENZAPRINE (FLEXERIL) 5 MG TABLET    Take 1 tablet (5 mg total) by mouth 3 (three) times daily as needed for muscle spasms.   IBUPROFEN (ADVIL,MOTRIN) 200 MG TABLET    Take by mouth as needed.   LEVOTHYROXINE (SYNTHROID, LEVOTHROID) 75 MCG TABLET    TAKE 1 TABLET BY MOUTH DAILY    Review of Systems  Constitutional: Negative.   Respiratory: Negative.   Cardiovascular: Negative.     Social History  Substance Use Topics  . Smoking status: Former Smoker    Quit date: 11/16/2014  . Smokeless tobacco: Never Used  . Alcohol use No   Objective:   BP 110/82 (BP Location: Right Arm, Patient Position: Sitting, Cuff Size: Normal)   Pulse 77   Temp 98.3 F (36.8 C)  (Oral)   Resp 16   Wt 249 lb 9.6 oz (113.2 kg)   SpO2 98%   BMI 34.81 kg/m   Physical Exam  Constitutional: She is oriented to person, place, and time. She appears well-developed and well-nourished. No distress.  HENT:  Head: Normocephalic and atraumatic.  Right Ear: Hearing normal.  Left Ear: Hearing normal.  Nose: Nose normal.  Eyes: Conjunctivae and lids are normal. Right eye exhibits no discharge. Left eye exhibits no discharge. No scleral icterus.  Neck: Neck supple. No thyromegaly present.  Cardiovascular: Normal rate and regular rhythm.   Pulmonary/Chest: Effort normal and breath sounds normal. No respiratory distress.  Musculoskeletal: Normal range of motion.  SLR's 90 degrees with slight tightness. No pain with ROM. Good heel/toe walking without numbness or loss of strength.  Lymphadenopathy:    She has no cervical adenopathy.  Neurological: She is alert and oriented to person, place, and time.  DTR's 1+ and symmetric knee jerks.  Skin: Skin is intact. No lesion and no rash noted.  Psychiatric: She has a normal mood and affect. Her speech is normal and behavior is normal. Thought content normal.   Depression screen [facility] 2/9 02/25/2017  Decreased Interest 0  Down, Depressed, Hopeless 0  PHQ - 2 Score 0  Altered sleeping 2  Tired, decreased energy 0  Change  in appetite 0  Feeling bad or failure about yourself  0  Trouble concentrating 0  Moving slowly or fidgety/restless 0  Suicidal thoughts 0  PHQ-9 Score 2       Assessment & Plan:     1. Low back pain with radiation Much improved with use of prednisone taper. Flexeril caused too much drowsiness and decided not to use it more than 3 days. No shape pains/spasms today. Good ROM and no numbness in left leg. Proceed with exercises and may use Aspercreme with Lidocaine. Recheck prn.  2. Adult hypothyroidism Changed Levothyroxine to 75 mcg qd in July of 2017. No tremor, palpitations, sweats, significant weight  gain/loss, brittle nails or hair loss. Will recheck blood levels and refill medication. - T4 - TSH - CBC with Differential/Platelet - T3, free  3. Depression screening Asymptomatic. PHQ-9 score 2. Slight alteration in sleep suspected secondary to low back pain with degenerative changes. Recheck annually.

## 2017-02-26 ENCOUNTER — Telehealth: Payer: Self-pay

## 2017-02-26 LAB — CBC WITH DIFFERENTIAL/PLATELET
BASOS ABS: 0.1 10*3/uL (ref 0.0–0.2)
BASOS: 1 %
EOS (ABSOLUTE): 0.2 10*3/uL (ref 0.0–0.4)
EOS: 1 %
HEMOGLOBIN: 14.9 g/dL (ref 11.1–15.9)
Hematocrit: 44.4 % (ref 34.0–46.6)
Immature Grans (Abs): 0.1 10*3/uL (ref 0.0–0.1)
Immature Granulocytes: 1 %
LYMPHS: 37 %
Lymphocytes Absolute: 3.9 10*3/uL — ABNORMAL HIGH (ref 0.7–3.1)
MCH: 31.4 pg (ref 26.6–33.0)
MCHC: 33.6 g/dL (ref 31.5–35.7)
MCV: 94 fL (ref 79–97)
MONOS ABS: 0.5 10*3/uL (ref 0.1–0.9)
Monocytes: 5 %
NEUTROS PCT: 55 %
Neutrophils Absolute: 5.9 10*3/uL (ref 1.4–7.0)
PLATELETS: 253 10*3/uL (ref 150–379)
RBC: 4.74 x10E6/uL (ref 3.77–5.28)
RDW: 14.1 % (ref 12.3–15.4)
WBC: 10.5 10*3/uL (ref 3.4–10.8)

## 2017-02-26 LAB — T3, FREE: T3 FREE: 3.3 pg/mL (ref 2.0–4.4)

## 2017-02-26 LAB — TSH: TSH: 8.27 u[IU]/mL — ABNORMAL HIGH (ref 0.450–4.500)

## 2017-02-26 LAB — T4: T4, Total: 7.7 ug/dL (ref 4.5–12.0)

## 2017-02-26 NOTE — Telephone Encounter (Signed)
Patient advised. She will call back to schedule a 6 month follow up appointment to recheck labs.

## 2017-02-26 NOTE — Telephone Encounter (Signed)
-----   Message from Margo Common, [place] sent at 02/26/2017  3:09 PM EST ----- Thyroid tests essentially normal except TSH elevated more. Recommend continuing the present dose daily then recheck progress in 6 months. Thyroid slow to respond to the Levothyroxine some times.

## 2017-03-07 ENCOUNTER — Encounter: Payer: Self-pay | Admitting: Family Medicine

## 2017-03-07 ENCOUNTER — Ambulatory Visit (INDEPENDENT_AMBULATORY_CARE_PROVIDER_SITE_OTHER): Payer: BC Managed Care – PPO | Admitting: Family Medicine

## 2017-03-07 VITALS — BP 110/78 | HR 105 | Temp 98.5°F | Resp 18 | Wt 245.8 lb

## 2017-03-07 DIAGNOSIS — R509 Fever, unspecified: Secondary | ICD-10-CM | POA: Diagnosis not present

## 2017-03-07 DIAGNOSIS — R52 Pain, unspecified: Secondary | ICD-10-CM

## 2017-03-07 DIAGNOSIS — B349 Viral infection, unspecified: Secondary | ICD-10-CM | POA: Diagnosis not present

## 2017-03-07 LAB — POCT INFLUENZA A/B
INFLUENZA B, POC: NEGATIVE
Influenza A, POC: NEGATIVE

## 2017-03-07 MED ORDER — OSELTAMIVIR PHOSPHATE 75 MG PO CAPS: 75.0000 mg | ORAL_CAPSULE | Freq: Two times a day (BID) | ORAL | 0 refills | Status: DC

## 2017-03-07 NOTE — Patient Instructions (Signed)

## 2017-03-07 NOTE — Progress Notes (Signed)
Patient: Linda Rogers Female    DOB: May 14, 1976   41 y.o.   MRN: [id number] Visit Date: 03/07/2017  Today's Provider: Vernie Murders, PA   Chief Complaint  Patient presents with  . Cough  . Fever  . Sore Throat   Subjective:    URI   This is a new problem. The current episode started yesterday. The problem has been unchanged. The maximum temperature recorded prior to her arrival was 100.4 - 100.9 F. Associated symptoms include congestion, coughing, sinus pain and a sore throat. Associated symptoms comments: Fever, body aches . She has tried acetaminophen (OTC cold and flu) for the symptoms. The treatment provided mild relief.   Patient Active Problem List   Diagnosis Date Noted  . 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 Cerebral artery occlusion with cerebral infarction ([place]) 06/15/2015  . Adult hypothyroidism 06/15/2015  . Migraine 06/15/2015   Past Surgical History:  Procedure Laterality Date  . ABDOMINAL HYSTERECTOMY  2005  . APPENDECTOMY  1987  . TONSILLECTOMY AND ADENOIDECTOMY     Family History  Problem Relation Age of Onset  . Ovarian cancer Mother   . Colon polyps Mother    No Known Allergies   Previous Medications   CLOTRIMAZOLE (MYCELEX) 10 MG TROCHE    Take 1 tablet (10 mg total) by mouth 5 (five) times daily.   IBUPROFEN (ADVIL,MOTRIN) 200 MG TABLET    Take by mouth as needed.   LEVOTHYROXINE (SYNTHROID, LEVOTHROID) 75 MCG TABLET    Take 1 tablet (75 mcg total) by mouth daily.    Review of Systems  Constitutional: Positive for fever.  HENT: Positive for congestion, sinus pain and sore throat.   Respiratory: Positive for cough.   Cardiovascular: Negative.   Musculoskeletal: Positive for myalgias.    Social History  Substance Use Topics  . Smoking status: Former Smoker    Quit date: 11/16/2014  . Smokeless tobacco: Never Used  . Alcohol use No   Objective:   BP 110/78 (BP Location: Right Arm, Patient Position: Sitting, Cuff  Size: Normal)   Pulse (!) 105   Temp 98.5 F (36.9 C) (Oral)   Resp 18   Wt 245 lb 12.8 oz (111.5 kg)   SpO2 98%   BMI 34.28 kg/m   Physical Exam  Constitutional: She is oriented to person, place, and time. She appears well-developed and well-nourished.  HENT:  Head: Normocephalic.  Right Ear: External ear normal.  Left Ear: External ear normal.  Nose: Nose normal.  Mouth/Throat: Oropharynx is clear and moist.  Slightly reddened posterior pharynx without exudates.  Eyes: Conjunctivae are normal. Pupils are equal, round, and reactive to light.  Neck: Neck supple.  Cardiovascular: Normal rate and regular rhythm.   Pulmonary/Chest: Effort normal and breath sounds normal.  Abdominal: Soft. Bowel sounds are normal.  Lymphadenopathy:    She has no cervical adenopathy.  Neurological: She is alert and oriented to person, place, and time.      Assessment & Plan:     1. Fever, unspecified fever cause Onset yesterday with non-productive cough and general malaise with sore throat. Taking Advil for fever and seem controlled now. Appetite diminished. Some headache. Flu test negative. Recommend influenza treatment and home to rest. Increase fluid intake and recheck prn. - POCT Influenza A/B  2. Body aches Onset yesterday with fever, cough and sore throat. Negative flu test. - POCT Influenza A/B  3. Viral illness Negative flu test but classic symptoms of sore  throat, cough, nasal congestion, fever, headache and body aches. May use Tylenol or Advil prn. Will treat with Tamiflu and continue OTC cough and cold medication for symptoms. Increase fluid intake and home to rest. Recheck prn. - oseltamivir (TAMIFLU) 75 MG capsule; Take 1 capsule (75 mg total) by mouth 2 (two) times daily.  Dispense: 10 capsule; Refill: 0

## 2017-05-03 ENCOUNTER — Ambulatory Visit (INDEPENDENT_AMBULATORY_CARE_PROVIDER_SITE_OTHER): Payer: BC Managed Care – PPO | Admitting: Family Medicine

## 2017-05-03 ENCOUNTER — Encounter: Payer: Self-pay | Admitting: Family Medicine

## 2017-05-03 VITALS — BP 148/90 | HR 86 | Temp 98.5°F | Wt 255.6 lb

## 2017-05-03 DIAGNOSIS — F321 Major depressive disorder, single episode, moderate: Secondary | ICD-10-CM | POA: Diagnosis not present

## 2017-05-03 MED ORDER — SERTRALINE HCL 50 MG PO TABS: 50.0000 mg | ORAL_TABLET | Freq: Every day | ORAL | 3 refills | Status: DC

## 2017-05-03 NOTE — Progress Notes (Signed)
Patient: Linda Rogers Female    DOB: [birth year]   41 y.o.   MRN: [id number] Visit Date: 05/03/2017  Today's Provider: Vernie Murders, PA   Chief Complaint  Patient presents with  . Anxiety  . Depression   Subjective:    Depression         This is a new problem.  Episode onset: 3 weeks ago.   The onset quality is sudden.   The problem occurs daily.  The problem has been gradually worsening since onset.  Associated symptoms include decreased concentration, fatigue, helplessness, hopelessness, insomnia, irritable, restlessness, decreased interest, appetite change and sad.     The symptoms are aggravated by family issues.  Past treatments include nothing.  Risk factors include major life event.   Past medical history includes hypothyroidism and anxiety.   Anxiety  Presents for initial visit. Episode onset: 3 weeks ago. The problem has been gradually worsening. Symptoms include decreased concentration, depressed mood, excessive worry, insomnia, irritability, nausea, nervous/anxious behavior, obsessions, palpitations, panic and restlessness. Episode frequency: daily. Current severity: severe yesterday and today. The symptoms are aggravated by family issues. The quality of sleep is poor.   Risk factors include a major life event. Past treatments include nothing.      Previous Medications   FEXOFENADINE HCL (ALLEGRA ALLERGY PO)    Take by mouth.   IBUPROFEN (ADVIL,MOTRIN) 200 MG TABLET    Take by mouth as needed.   LEVOTHYROXINE (SYNTHROID, LEVOTHROID) 75 MCG TABLET    Take 1 tablet (75 mcg total) by mouth daily.   MULTIPLE VITAMIN (MULTIVITAMIN) TABLET    Take 1 tablet by mouth daily.    Review of Systems  Constitutional: Positive for appetite change, fatigue and irritability.       Frequent crying spells   Respiratory: Negative.   Cardiovascular: Positive for palpitations.  Gastrointestinal: Positive for nausea.  Psychiatric/Behavioral: Positive for decreased concentration and  depression. The patient is nervous/anxious and has insomnia.     Social History  Substance Use Topics  . Smoking status: Former Smoker    Quit date: 11/16/2014  . Smokeless tobacco: Never Used  . Alcohol use No   Objective:   BP (!) 148/90 (BP Location: Right Arm, Patient Position: Sitting, Cuff Size: Normal)   Pulse 86   Temp 98.5 F (36.9 C) (Oral)   Wt 255 lb 9.6 oz (115.9 kg)   SpO2 99%   BMI 35.65 kg/m   Physical Exam  Constitutional: She is oriented to person, place, and time. She appears well-developed and well-nourished. She is irritable.  HENT:  Head: Normocephalic.  Eyes: Conjunctivae are normal.  Neck: Neck supple.  Cardiovascular: Normal rate and regular rhythm.   Pulmonary/Chest: Effort normal and breath sounds normal.  Abdominal: Soft. Bowel sounds are normal.  Musculoskeletal: Normal range of motion.  Neurological: She is alert and oriented to person, place, and time.  Psychiatric: Her speech is normal. Judgment normal. Her mood appears anxious. She is agitated. Cognition and memory are normal. She exhibits a depressed mood. She expresses no suicidal plans and no homicidal plans.    Depression screen PHQ 2/9 05/03/2017  Decreased Interest 3  Down, Depressed, Hopeless 3  PHQ - 2 Score 6  Altered sleeping 3  Tired, decreased energy 3  Change in appetite 3  Feeling bad or failure about yourself  3  Trouble concentrating 3  Moving slowly or fidgety/restless 3  Suicidal thoughts 0  PHQ-9 Score 24      Assessment &  Plan:     1. Depression, major, single episode, moderate ([place]) Developed spontaneous crying spells, mind racing, sadness but no suicidal ideation over the past 2-3 weeks. Very up set with the news of her father finding out he has a "mass" in his remaining kidney (has one removed with a liver mass in the past). Also, worried about her daughter's family losing the house they have been living in while looking for a new home. PHQ-9 confirms depression.  Will treat with Zoloft 50 mg qd and recheck in 2-3 weeks. Recommend counseling with her pastor or consider referral to a psychologist. - sertraline (ZOLOFT) 50 MG tablet; Take 1 tablet (50 mg total) by mouth daily.  Dispense: 30 tablet; Refill: 3

## 2017-05-03 NOTE — Patient Instructions (Signed)
Major Depressive Disorder, Adult Major depressive disorder (MDD) is a mental health condition. It may also be called clinical depression or unipolar depression. MDD usually causes feelings of sadness, hopelessness, or helplessness. MDD can also cause physical symptoms. It can interfere with work, school, relationships, and other everyday activities. MDD may be mild, moderate, or severe. It may occur once (single episode major depressive disorder) or it may occur multiple times (recurrent major depressive disorder). What are the causes? The exact cause of this condition is not known. MDD is most likely caused by a combination of things, which may include:  Genetic factors. These are traits that are passed along from parent to child.  Individual factors. Your personality, your behavior, and the way you handle your thoughts and feelings may contribute to MDD. This includes personality traits and behaviors learned from others.  Physical factors, such as: ? Differences in the part of your brain that controls emotion. This part of your brain may be different than it is in people who do not have MDD. ? Long-term (chronic) medical or psychiatric illnesses.  Social factors. Traumatic experiences or major life changes may play a role in the development of MDD.  What increases the risk? This condition is more likely to develop in women. The following factors may also make you more likely to develop MDD:  A family history of depression.  Troubled family relationships.  Abnormally low levels of certain brain chemicals.  Traumatic events in childhood, especially abuse or the loss of a parent.  Being under a lot of stress, or long-term stress, especially from upsetting life experiences or losses.  A history of: ? Chronic physical illness. ? Other mental health disorders. ? Substance abuse.  Poor living conditions.  Experiencing social exclusion or discrimination on a regular basis.  What are  the signs or symptoms? The main symptoms of MDD typically include:  Constant depressed or irritable mood.  Loss of interest in things and activities.  MDD symptoms may also include:  Sleeping or eating too much or too little.  Unexplained weight change.  Fatigue or low energy.  Feelings of worthlessness or guilt.  Difficulty thinking clearly or making decisions.  Thoughts of suicide or of harming others.  Physical agitation or weakness.  Isolation.  Severe cases of MDD may also occur with other symptoms, such as:  Delusions or hallucinations, in which you imagine things that are not real (psychotic depression).  Low-level depression that lasts at least a year (chronic depression or persistent depressive disorder).  Extreme sadness and hopelessness (melancholic depression).  Trouble speaking and moving (catatonic depression).  How is this diagnosed? This condition may be diagnosed based on:  Your symptoms.  Your medical history, including your mental health history. This may involve tests to evaluate your mental health. You may be asked questions about your lifestyle, including any drug and alcohol use, and how long you have had symptoms of MDD.  A physical exam.  Blood tests to rule out other conditions.  You must have a depressed mood and at least four other MDD symptoms most of the day, nearly every day in the same 2-week timeframe before your health care provider can confirm a diagnosis of MDD. How is this treated? This condition is usually treated by mental health professionals, such as psychologists, psychiatrists, and clinical social workers. You may need more than one type of treatment. Treatment may include:  Psychotherapy. This is also called talk therapy or counseling. Types of psychotherapy include: ? Cognitive behavioral   therapy (CBT). This type of therapy teaches you to recognize unhealthy feelings, thoughts, and behaviors, and replace them with  positive thoughts and actions. ? Interpersonal therapy (IPT). This helps you to improve the way you relate to and communicate with others. ? Family therapy. This treatment includes members of your family.  Medicine to treat anxiety and depression, or to help you control certain emotions and behaviors.  Lifestyle changes, such as: ? Limiting alcohol and drug use. ? Exercising regularly. ? Getting plenty of sleep. ? Making healthy eating choices. ? Spending more time outdoors.  Treatments involving stimulation of the brain can be used in situations with extremely severe symptoms, or when medicine or other therapies do not work over time. These treatments include electroconvulsive therapy, transcranial magnetic stimulation, and vagal nerve stimulation. Follow these instructions at home: Activity  Return to your normal activities as told by your health care provider.  Exercise regularly and spend time outdoors as told by your health care provider. General instructions  Take over-the-counter and prescription medicines only as told by your health care provider.  Do not drink alcohol. If you drink alcohol, limit your alcohol intake to no more than 1 drink a day for nonpregnant women and 2 drinks a day for men. One drink equals 12 oz of beer, 5 oz of wine, or 1 oz of hard liquor. Alcohol can affect any antidepressant medicines you are taking. Talk to your health care provider about your alcohol use.  Eat a healthy diet and get plenty of sleep.  Find activities that you enjoy doing, and make time to do them.  Consider joining a support group. Your health care provider may be able to recommend a support group.  Keep all follow-up visits as told by your health care provider. This is important. Where to find more information: National Alliance on Mental Illness  www.nami.org  U.S. National Institute of Mental Health  www.nimh.nih.gov  National Suicide Prevention  Lifeline  1-800-273-TALK (8255). This is free, 24-hour help.  Contact a health care provider if:  Your symptoms get worse.  You develop new symptoms. Get help right away if:  You self-harm.  You have serious thoughts about hurting yourself or others.  You see, hear, taste, smell, or feel things that are not present (hallucinate). This information is not intended to replace advice given to you by your health care provider. Make sure you discuss any questions you have with your health care provider. Document Released: 04/13/2013 Document Revised: 08/23/2016 Document Reviewed: 06/27/2016 Elsevier Interactive Patient Education  2017 Elsevier Inc.  

## 2017-05-16 DIAGNOSIS — F321 Major depressive disorder, single episode, moderate: Secondary | ICD-10-CM | POA: Insufficient documentation

## 2017-05-17 ENCOUNTER — Encounter: Payer: Self-pay | Admitting: Family Medicine

## 2017-05-17 ENCOUNTER — Ambulatory Visit (INDEPENDENT_AMBULATORY_CARE_PROVIDER_SITE_OTHER): Payer: BC Managed Care – PPO | Admitting: Family Medicine

## 2017-05-17 ENCOUNTER — Ambulatory Visit: Payer: BC Managed Care – PPO | Admitting: Family Medicine

## 2017-05-17 VITALS — BP 118/82 | HR 72 | Temp 97.9°F | Wt 249.6 lb

## 2017-05-17 DIAGNOSIS — F321 Major depressive disorder, single episode, moderate: Secondary | ICD-10-CM | POA: Diagnosis not present

## 2017-05-17 DIAGNOSIS — Z23 Encounter for immunization: Secondary | ICD-10-CM

## 2017-05-17 NOTE — Progress Notes (Signed)
Patient: Linda Rogers Female    DOB: [birth year]   41 y.o.   MRN: [id number] Visit Date: 05/17/2017  Today's Provider: Vernie Murders, PA   Chief Complaint  Patient presents with  . Anxiety  . Depression  . Follow-up   Subjective:    HPI Depression & Anxiety Follow Up:  Patient is here for a 2 week follow up. Last OV was 05/03/2017. New episode of major depression. Started Zoloft 50 mg and recommended counseling with pastor or a referral to psychiatrist. Patient reports good compliance with treatment plan. Symptoms have improved. She reports feeling much better in the past week.     Patient Active Problem List   Diagnosis Date Noted  . Depression, major, single episode, moderate ([place]) 05/16/2017  . 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 Cerebral artery occlusion with cerebral infarction ([place]) 06/15/2015  . Adult hypothyroidism 06/15/2015  . Migraine 06/15/2015   Past Surgical History:  Procedure Laterality Date  . ABDOMINAL HYSTERECTOMY  2005  . APPENDECTOMY  1987  . TONSILLECTOMY AND ADENOIDECTOMY     Family History  Problem Relation Age of Onset  . Ovarian cancer Mother   . Colon polyps Mother    No Known Allergies  Previous Medications   FEXOFENADINE HCL (ALLEGRA ALLERGY PO)    Take by mouth.   IBUPROFEN (ADVIL,MOTRIN) 200 MG TABLET    Take by mouth as needed.   LEVOTHYROXINE (SYNTHROID, LEVOTHROID) 75 MCG TABLET    Take 1 tablet (75 mcg total) by mouth daily.   MULTIPLE VITAMIN (MULTIVITAMIN) TABLET    Take 1 tablet by mouth daily.   SERTRALINE (ZOLOFT) 50 MG TABLET    Take 1 tablet (50 mg total) by mouth daily.     Review of Systems  Constitutional: Negative.   Respiratory: Negative.   Cardiovascular: Negative.   Psychiatric/Behavioral: Positive for dysphoric mood. The patient is nervous/anxious.     Social History  Substance Use Topics  . Smoking status: Former Smoker    Quit date: 11/16/2014  . Smokeless tobacco: Never Used  .  Alcohol use No   Objective:   BP 118/82 (BP Location: Right Arm, Patient Position: Sitting, Cuff Size: Normal)   Pulse 72   Temp 97.9 F (36.6 C) (Oral)   Wt 249 lb 9.6 oz (113.2 kg)   SpO2 99%   BMI 34.81 kg/m   Physical Exam  Constitutional: She is oriented to person, place, and time. She appears well-developed and well-nourished. No distress.  HENT:  Head: Normocephalic and atraumatic.  Right Ear: Hearing normal.  Left Ear: Hearing normal.  Nose: Nose normal.  Eyes: Conjunctivae and lids are normal. Right eye exhibits no discharge. Left eye exhibits no discharge. No scleral icterus.  Neck: Neck supple. No thyromegaly present.  Cardiovascular: Normal rate and regular rhythm.   Pulmonary/Chest: Effort normal and breath sounds normal. No respiratory distress.  Abdominal: Soft. Bowel sounds are normal.  Musculoskeletal: Normal range of motion.  Neurological: She is alert and oriented to person, place, and time.  Skin: Skin is intact. No lesion and no rash noted.  Psychiatric: She has a normal mood and affect. Her speech is normal and behavior is normal. Thought content normal.      Assessment & Plan:     1. Depression, major, single episode, moderate ([facility]) Improved sleep, less racing of mind, no spontaneous crying spells now and feeling more energetic. Tolerating the Zoloft 50 mg qd well. A little concerned about eating more. Recommend  she not stress over major decisions or life changes until depression under better/stable control. Restrict caloric intake and exercise regularly. Recheck in 3 months. Continue present medication.

## 2017-05-29 ENCOUNTER — Other Ambulatory Visit: Payer: Self-pay | Admitting: Family Medicine

## 2017-05-29 DIAGNOSIS — Z1231 Encounter for screening mammogram for malignant neoplasm of breast: Secondary | ICD-10-CM

## 2017-06-10 ENCOUNTER — Ambulatory Visit
Admission: RE | Admit: 2017-06-10 | Discharge: 2017-06-10 | Disposition: A | Discharge status: Home / Self Care | Payer: BC Managed Care – PPO | Source: Ambulatory Visit | Attending: Family Medicine | Admitting: Family Medicine

## 2017-06-10 DIAGNOSIS — Z1231 Encounter for screening mammogram for malignant neoplasm of breast: Secondary | ICD-10-CM

## 2017-06-10 HISTORY — DX: Malignant (primary) neoplasm, unspecified: C80.1

## 2017-08-12 ENCOUNTER — Ambulatory Visit (INDEPENDENT_AMBULATORY_CARE_PROVIDER_SITE_OTHER): Payer: BC Managed Care – PPO | Admitting: Family Medicine

## 2017-08-12 ENCOUNTER — Encounter: Payer: Self-pay | Admitting: Family Medicine

## 2017-08-12 VITALS — BP 110/78 | HR 92 | Temp 98.7°F | Ht 71.0 in | Wt 253.6 lb

## 2017-08-12 DIAGNOSIS — F321 Major depressive disorder, single episode, moderate: Secondary | ICD-10-CM

## 2017-08-12 DIAGNOSIS — E039 Hypothyroidism, unspecified: Secondary | ICD-10-CM | POA: Diagnosis not present

## 2017-08-12 DIAGNOSIS — T753XXA Motion sickness, initial encounter: Secondary | ICD-10-CM | POA: Diagnosis not present

## 2017-08-12 MED ORDER — SCOPOLAMINE 1 MG/3DAYS TD PT72: 1.0000 | MEDICATED_PATCH | TRANSDERMAL | 0 refills | Status: DC

## 2017-08-12 NOTE — Progress Notes (Signed)
Patient: Linda Rogers Female    DOB: 03/17/1976   41 y.o.   MRN: [id number] Visit Date: 08/12/2017  Today's Provider: Vernie Murders, PA   Chief Complaint  Patient presents with  . Depression  . Anxiety   Subjective:    HPI Depression & Anxiety Follow Up:  Patient is here for a 3 month follow up. Last OV was 05/17/2017. Advised to continue Zoloft 50 mg. Recommended to try not stressing over decisions in life until depression was stable. Advised her to restrict caloric intake and exercise regularly. Patient reports fair compliance with treatment plan and recommendations. Symptoms are stable. She reports the Zoloft is making her drowsy and increased appetite, so she started  taking 1/2 tablets every other day.   Patient Active Problem List   Diagnosis Date Noted  . Depression, major, single episode, moderate ([place]) 05/16/2017  . Malignant neoplasm of cervix uteri ([place]) 06/15/2015  . Cerebral artery occlusion with cerebral infarction ([place]) 06/15/2015  . Adult hypothyroidism 06/15/2015  . Migraine 06/15/2015   Past Surgical History:  Procedure Laterality Date  . ABDOMINAL HYSTERECTOMY  2005  . APPENDECTOMY  1987  . TONSILLECTOMY AND ADENOIDECTOMY     Family History  Problem Relation Age of Onset  . Ovarian cancer Mother   . Colon polyps Mother   . Breast cancer Neg Hx    No Known Allergies   Previous Medications   FEXOFENADINE HCL (ALLEGRA ALLERGY PO)    Take by mouth.   IBUPROFEN (ADVIL,MOTRIN) 200 MG TABLET    Take by mouth as needed.   LEVOTHYROXINE (SYNTHROID, LEVOTHROID) 75 MCG TABLET    Take 1 tablet (75 mcg total) by mouth daily.   MULTIPLE VITAMIN (MULTIVITAMIN) TABLET    Take 1 tablet by mouth daily.   SERTRALINE (ZOLOFT) 50 MG TABLET    Take 1 tablet (50 mg total) by mouth daily.    Review of Systems  Constitutional: Positive for appetite change.  Respiratory: Negative.   Cardiovascular: Negative.   Psychiatric/Behavioral: Positive for dysphoric mood.  The patient is nervous/anxious.     Social History  Substance Use Topics  . Smoking status: Former Smoker    Quit date: 11/16/2014  . Smokeless tobacco: Never Used  . Alcohol use No   Objective:   BP 110/78 (BP Location: Right Arm, Patient Position: Sitting, Cuff Size: Normal)   Pulse 92   Temp 98.7 F (37.1 C) (Oral)   Ht 5\' 11"  (1.803 m)   Wt 253 lb 9.6 oz (115 kg)   SpO2 99%   BMI 35.37 kg/m  Wt Readings from Last 3 Encounters:  08/12/17 253 lb 9.6 oz (115 kg)  05/17/17 249 lb 9.6 oz (113.2 kg)  05/03/17 255 lb 9.6 oz (115.9 kg)    Physical Exam  Constitutional: She is oriented to person, place, and time. She appears well-developed and well-nourished. No distress.  HENT:  Head: Normocephalic and atraumatic.  Right Ear: Hearing normal.  Left Ear: Hearing normal.  Nose: Nose normal.  Eyes: Conjunctivae and lids are normal. Right eye exhibits no discharge. Left eye exhibits no discharge. No scleral icterus.  Neck: Neck supple. No thyromegaly present.  Cardiovascular: Normal rate.   Pulmonary/Chest: Effort normal and breath sounds normal. No respiratory distress.  Abdominal: Soft. Bowel sounds are normal.  Musculoskeletal: Normal range of motion.  Neurological: She is alert and oriented to person, place, and time.  Skin: Skin is intact. No lesion and no rash noted.  Psychiatric: She has a  normal mood and affect. Her speech is normal and behavior is normal. Thought content normal.      Assessment & Plan:     1. Depression, major, single episode, moderate ([place]) Much improved and stable since starting the Sertraline in May 2018. No further depressive or anxious symptoms. Had decreased the Sertraline to 50 mg 1/2 tablet QOD. May continue as is or taper down to stopping over the next 3 months. Recheck CBC, CMP, TSH and T4 with history of hypothyroidism. Follow up pending reports. - CBC with Differential/Platelet - Comprehensive metabolic panel - TSH - T4  2. Adult  hypothyroidism Tolerating Levothyroxine 75 mcg qd. Denies palpitations, heat or cold intolerance, sweats or significant weight change. Will recheck labs and follow up pending reports. - CBC with Differential/Platelet - Comprehensive metabolic panel - TSH - T4  3. Sea sickness, initial encounter Has a history of seasickness and plans a cruise in September. Requests Transderm-Scop patches. Check CMP and prescription written. - scopolamine (TRANSDERM-SCOP) 1 MG/3DAYS; Place 1 patch (1.5 mg total) onto the skin every 3 (three) days. As needed for seasickness.  Dispense: 4 patch; Refill: 0 - Comprehensive metabolic panel

## 2017-08-16 ENCOUNTER — Ambulatory Visit: Payer: BC Managed Care – PPO | Admitting: Family Medicine

## 2017-08-16 ENCOUNTER — Other Ambulatory Visit
Admission: RE | Admit: 2017-08-16 | Discharge: 2017-08-16 | Disposition: A | Discharge status: Home / Self Care | Payer: BC Managed Care – PPO | Source: Ambulatory Visit | Attending: Family Medicine | Admitting: Family Medicine

## 2017-08-16 DIAGNOSIS — T753XXA Motion sickness, initial encounter: Secondary | ICD-10-CM | POA: Diagnosis present

## 2017-08-16 DIAGNOSIS — F321 Major depressive disorder, single episode, moderate: Secondary | ICD-10-CM | POA: Diagnosis not present

## 2017-08-16 DIAGNOSIS — E039 Hypothyroidism, unspecified: Secondary | ICD-10-CM | POA: Diagnosis present

## 2017-08-16 LAB — COMPREHENSIVE METABOLIC PANEL
ALT: 10 U/L — ABNORMAL LOW (ref 14–54)
ANION GAP: 5 (ref 5–15)
AST: 16 U/L (ref 15–41)
Albumin: 3.9 g/dL (ref 3.5–5.0)
Alkaline Phosphatase: 44 U/L (ref 38–126)
BUN: 9 mg/dL (ref 6–20)
CHLORIDE: 104 mmol/L (ref 101–111)
CO2: 26 mmol/L (ref 22–32)
CREATININE: 0.71 mg/dL (ref 0.44–1.00)
Calcium: 8.8 mg/dL — ABNORMAL LOW (ref 8.9–10.3)
Glucose, Bld: 100 mg/dL — ABNORMAL HIGH (ref 65–99)
POTASSIUM: 3.8 mmol/L (ref 3.5–5.1)
Sodium: 135 mmol/L (ref 135–145)
Total Bilirubin: 0.6 mg/dL (ref 0.3–1.2)
Total Protein: 7.1 g/dL (ref 6.5–8.1)

## 2017-08-16 LAB — CBC WITH DIFFERENTIAL/PLATELET
Basophils Absolute: 0.1 10*3/uL (ref 0–0.1)
Basophils Relative: 1 %
Eosinophils Absolute: 0.1 10*3/uL (ref 0–0.7)
Eosinophils Relative: 1 %
HEMATOCRIT: 40.4 % (ref 35.0–47.0)
HEMOGLOBIN: 13.7 g/dL (ref 12.0–16.0)
LYMPHS ABS: 2.4 10*3/uL (ref 1.0–3.6)
LYMPHS PCT: 32 %
MCH: 31.3 pg (ref 26.0–34.0)
MCHC: 33.9 g/dL (ref 32.0–36.0)
MCV: 92.2 fL (ref 80.0–100.0)
MONOS PCT: 6 %
Monocytes Absolute: 0.4 10*3/uL (ref 0.2–0.9)
NEUTROS ABS: 4.5 10*3/uL (ref 1.4–6.5)
NEUTROS PCT: 60 %
PLATELETS: 174 10*3/uL (ref 150–440)
RBC: 4.38 MIL/uL (ref 3.80–5.20)
RDW: 13.5 % (ref 11.5–14.5)
WBC: 7.4 10*3/uL (ref 3.6–11.0)

## 2017-08-16 LAB — TSH: TSH: 6.135 u[IU]/mL — ABNORMAL HIGH (ref 0.350–4.500)

## 2017-08-17 LAB — T4: T4, Total: 6.9 ug/dL (ref 4.5–12.0)

## 2017-08-18 ENCOUNTER — Other Ambulatory Visit: Payer: Self-pay | Admitting: Family Medicine

## 2017-08-19 NOTE — Progress Notes (Signed)
Advised  ED 

## 2017-11-27 ENCOUNTER — Ambulatory Visit: Payer: BC Managed Care – PPO | Admitting: Physician Assistant

## 2017-11-27 ENCOUNTER — Encounter: Payer: Self-pay | Admitting: Physician Assistant

## 2017-11-27 VITALS — BP 112/68 | HR 68 | Temp 98.2°F | Resp 16 | Wt 260.0 lb

## 2017-11-27 DIAGNOSIS — N3090 Cystitis, unspecified without hematuria: Secondary | ICD-10-CM | POA: Diagnosis not present

## 2017-11-27 DIAGNOSIS — R300 Dysuria: Secondary | ICD-10-CM | POA: Diagnosis not present

## 2017-11-27 MED ORDER — SULFAMETHOXAZOLE-TRIMETHOPRIM 800-160 MG PO TABS: 1.0000 | ORAL_TABLET | Freq: Two times a day (BID) | ORAL | 0 refills | Status: AC

## 2017-11-27 NOTE — Patient Instructions (Signed)

## 2017-11-27 NOTE — Progress Notes (Signed)
[place]  Chief Complaint  Patient presents with  . Urinary Tract Infection    Subjective:    Patient ID: Linda Rogers, female    DOB: [birth year], 41 y.o.   MRN: [id number]   Urinary Tract Infection: Patient complains of burning with urination, frequency, hematuria and urgency She has had symptoms for 1 day. Patient denies back pain, stomach ache and vaginal discharge. Patient does not have a history of recurrent UTI.  Patient does not have a history of pyelonephritis or other renal issues. Patient denies vaginal discharge and denies new sexual partners. The patient denies recent travel outside of the [place].  Review of Systems  Constitutional: Negative.   Gastrointestinal: Negative.   Genitourinary: Positive for dysuria, frequency, hematuria and urgency. Negative for flank pain.  Neurological: Negative for dizziness and headaches.       Objective:   BP 112/68 (BP Location: Left Arm, Patient Position: Sitting, Cuff Size: Large)   Pulse 68   Temp 98.2 F (36.8 C) (Oral)   Resp 16   Wt 260 lb (117.9 kg)   BMI 36.26 kg/m   Patient Active Problem List   Diagnosis Date Noted  . Depression, major, single episode, moderate ([place]) 05/16/2017  . Malignant neoplasm of cervix uteri ([place]) 06/15/2015  . Cerebral artery occlusion with cerebral infarction ([place]) 06/15/2015  . Adult hypothyroidism 06/15/2015  . Migraine 06/15/2015    Outpatient Encounter Medications as of 11/27/2017  Medication Sig Note  . Fexofenadine HCl (ALLEGRA ALLERGY PO) Take by mouth.   Marland Kitchen ibuprofen (ADVIL,MOTRIN) 200 MG tablet Take by mouth as needed. 08/30/2015: Received from: [facility]: Take by mouth.  . levothyroxine (SYNTHROID, LEVOTHROID) 75 MCG tablet Take 1 tablet (75 mcg total) by mouth daily.   . Multiple Vitamin (MULTIVITAMIN) tablet Take 1 tablet by mouth daily.   Marland Kitchen scopolamine (TRANSDERM-SCOP) 1 MG/3DAYS Place 1  patch (1.5 mg total) onto the skin every 3 (three) days. As needed for seasickness.   . sertraline (ZOLOFT) 50 MG tablet Take 1 tablet (50 mg total) by mouth daily. (Patient not taking: Reported on 11/27/2017)    No facility-administered encounter medications on file as of 11/27/2017.     No Known Allergies     Physical Exam  Constitutional: She is oriented to person, place, and time. She appears well-developed and well-nourished. No distress.  Cardiovascular: Normal rate and regular rhythm.  Pulmonary/Chest: Effort normal and breath sounds normal.  Abdominal: Soft. Bowel sounds are normal. She exhibits no distension. There is tenderness in the suprapubic area. There is no rebound, no guarding and no CVA tenderness.  Neurological: She is alert and oriented to person, place, and time.  Skin: Skin is warm and dry. She is not diaphoretic.  Psychiatric: She has a normal mood and affect. Her behavior is normal.       Assessment & Plan:  1. Cystitis  - sulfamethoxazole-trimethoprim (BACTRIM DS,SEPTRA DS) 800-160 MG tablet; Take 1 tablet by mouth 2 (two) times daily for 3 days.  Dispense: 6 tablet; Refill: 0 - CULTURE, URINE COMPREHENSIVE  2. Dysuria  - POCT Urinalysis Dipstick  Return if symptoms worsen or fail to improve.  The entirety of the information documented in the History of Present Illness, Review of Systems and Physical Exam were personally obtained by me. Portions of this information were initially documented by Ashley Royalty, CMA and reviewed by me for thoroughness and accuracy.

## 2017-11-28 LAB — POCT URINALYSIS DIPSTICK
Bilirubin, UA: NEGATIVE
Glucose, UA: NEGATIVE
Ketones, UA: NEGATIVE
Nitrite, UA: NEGATIVE
Protein, UA: NEGATIVE
Spec Grav, UA: 1.015 (ref 1.010–1.025)
Urobilinogen, UA: 0.2 E.U./dL
pH, UA: 6.5 (ref 5.0–8.0)

## 2017-11-30 LAB — CULTURE, URINE COMPREHENSIVE
MICRO NUMBER:: 81336850
SPECIMEN QUALITY:: ADEQUATE

## 2017-12-02 ENCOUNTER — Telehealth: Payer: Self-pay

## 2017-12-02 NOTE — Telephone Encounter (Signed)
Pt advised. She reports she is feeling much better.   Thanks,   -Mickel Baas

## 2017-12-02 NOTE — Telephone Encounter (Signed)
-----   Message from Trinna Post, [place] sent at 12/02/2017  1:05 PM EST ----- Urine cx grew E. Coli sensitive to the Bactrim prescribed. Is she feeling better?

## 2017-12-30 ENCOUNTER — Encounter: Payer: Self-pay | Admitting: Family Medicine

## 2017-12-30 ENCOUNTER — Ambulatory Visit: Payer: BC Managed Care – PPO | Admitting: Family Medicine

## 2017-12-30 VITALS — BP 144/82 | HR 91 | Temp 98.3°F | Wt 267.6 lb

## 2017-12-30 DIAGNOSIS — J0120 Acute ethmoidal sinusitis, unspecified: Secondary | ICD-10-CM

## 2017-12-30 MED ORDER — AMOXICILLIN 875 MG PO TABS: 875.0000 mg | ORAL_TABLET | Freq: Two times a day (BID) | ORAL | 0 refills | Status: DC

## 2017-12-30 NOTE — Progress Notes (Signed)
Patient: Linda Rogers Female    DOB: 28-Nov-1976   41 y.o.   MRN: [id number] Visit Date: 12/30/2017  Today's Provider: Vernie Murders, PA   Chief Complaint  Patient presents with  . Sinusitis   Subjective:    Sinusitis  This is a new problem. The current episode started 1 to 4 weeks ago. The problem has been gradually worsening since onset. There has been no fever. Associated symptoms include congestion, coughing, ear pain, headaches, shortness of breath (on exertion), sinus pressure and a sore throat. Treatments tried: OTC cold and sinus medications. The treatment provided no relief.   Past Medical History:  Diagnosis Date  . Cancer ([place])    prev cancerous cells uterus   Past Surgical History:  Procedure Laterality Date  . ABDOMINAL HYSTERECTOMY  2005  . APPENDECTOMY  1987  . TONSILLECTOMY AND ADENOIDECTOMY     Family History  Problem Relation Age of Onset  . Ovarian cancer Mother   . Colon polyps Mother   . Breast cancer Neg Hx    No Known Allergies  Current Outpatient Medications:  .  Fexofenadine HCl (ALLEGRA ALLERGY PO), Take by mouth., Disp: , Rfl:  .  ibuprofen (ADVIL,MOTRIN) 200 MG tablet, Take by mouth as needed., Disp: , Rfl:  .  levothyroxine (SYNTHROID, LEVOTHROID) 75 MCG tablet, Take 1 tablet (75 mcg total) by mouth daily., Disp: 90 tablet, Rfl: 3 .  Multiple Vitamin (MULTIVITAMIN) tablet, Take 1 tablet by mouth daily., Disp: , Rfl:   Review of Systems  Constitutional: Negative.   HENT: Positive for congestion, ear pain, sinus pressure and sore throat.   Respiratory: Positive for cough and shortness of breath (on exertion).   Cardiovascular: Negative.   Neurological: Positive for headaches.   Social History   Tobacco Use  . Smoking status: Former Smoker    Last attempt to quit: 11/16/2014    Years since quitting: 3.1  . Smokeless tobacco: Never Used  Substance Use Topics  . Alcohol use: No    Alcohol/week: 0.0 oz   Objective:   BP  (!) 144/82 (BP Location: Right Arm, Patient Position: Sitting, Cuff Size: Normal)   Pulse 91   Temp 98.3 F (36.8 C) (Oral)   Wt 267 lb 9.6 oz (121.4 kg)   SpO2 98%   BMI 37.32 kg/m   Physical Exam  Constitutional: She is oriented to person, place, and time. She appears well-developed and well-nourished. No distress.  HENT:  Head: Normocephalic and atraumatic.  Right Ear: Hearing and external ear normal.  Left Ear: Hearing and external ear normal.  Nose: Nose normal.  Slight cobblestoning of posterior pharynx and tender ethmoid sinuses to palpation. Fair transillumination throughtout.  Eyes: Conjunctivae and lids are normal. Right eye exhibits no discharge. Left eye exhibits no discharge. No scleral icterus.  Neck: Neck supple.  Cardiovascular: Normal rate and regular rhythm.  Pulmonary/Chest: Effort normal. No respiratory distress.  Abdominal: Soft.  Musculoskeletal: Normal range of motion.  Lymphadenopathy:    She has no cervical adenopathy.  Neurological: She is alert and oriented to person, place, and time.  Skin: Skin is intact. No lesion and no rash noted.  Psychiatric: She has a normal mood and affect. Her speech is normal and behavior is normal. Thought content normal.      Assessment & Plan:     1. Subacute ethmoidal sinusitis Onset after recurrent URI's the past 3-4 weeks. No relief from Afrin and Allegra. Recommend Mucinex-DM, Flonase and add Amoxicillin.  Increase fluid intake and wash hands frequently to prevent further respiratory illnesses. Recheck prn. - amoxicillin (AMOXIL) 875 MG tablet; Take 1 tablet (875 mg total) by mouth 2 (two) times daily.  Dispense: 20 tablet; Refill: [place], PA  [facility]

## 2018-02-28 ENCOUNTER — Other Ambulatory Visit: Payer: Self-pay | Admitting: Family Medicine

## 2018-02-28 DIAGNOSIS — E039 Hypothyroidism, unspecified: Secondary | ICD-10-CM

## 2018-03-10 ENCOUNTER — Encounter: Payer: Self-pay | Admitting: Family Medicine

## 2018-03-10 ENCOUNTER — Ambulatory Visit: Payer: BC Managed Care – PPO | Admitting: Family Medicine

## 2018-03-10 VITALS — BP 132/78 | Temp 97.9°F | Resp 16

## 2018-03-10 DIAGNOSIS — M5441 Lumbago with sciatica, right side: Secondary | ICD-10-CM | POA: Diagnosis not present

## 2018-03-10 MED ORDER — CYCLOBENZAPRINE HCL 5 MG PO TABS: 5.0000 mg | ORAL_TABLET | Freq: Three times a day (TID) | ORAL | 1 refills | Status: DC | PRN

## 2018-03-10 MED ORDER — PREDNISONE 10 MG PO TABS: 10.0000 mg | ORAL_TABLET | Freq: Every day | ORAL | 0 refills | Status: DC

## 2018-03-10 NOTE — Progress Notes (Signed)
Patient: Linda Rogers Female    DOB: [birth year]   42 y.o.   MRN: [id number] Visit Date: 03/10/2018  Today's Provider: Vernie Murders, PA   Chief Complaint  Patient presents with  . Sciatica   Subjective:    HPI Patient comes in today c/o sciatica. She reports that she has had symptoms for the last 3 weeks. She has used her TENS unit and heat to help with the pain. She reports that this has not helped. She describes it as a shooting pain in her lower back.    Past Medical History:  Diagnosis Date  . Cancer ([place])    prev cancerous cells uterus   Past Surgical History:  Procedure Laterality Date  . ABDOMINAL HYSTERECTOMY  2005  . APPENDECTOMY  1987  . TONSILLECTOMY AND ADENOIDECTOMY     Family History  Problem Relation Age of Onset  . Ovarian cancer Mother   . Colon polyps Mother   . Breast cancer Neg Hx    No Known Allergies  Current Outpatient Medications:  .  Fexofenadine HCl (ALLEGRA ALLERGY PO), Take by mouth., Disp: , Rfl:  .  ibuprofen (ADVIL,MOTRIN) 200 MG tablet, Take by mouth as needed., Disp: , Rfl:  .  levothyroxine (SYNTHROID, LEVOTHROID) 75 MCG tablet, TAKE 1 TABLET(75 MCG) BY MOUTH DAILY, Disp: 90 tablet, Rfl: 3 .  Multiple Vitamin (MULTIVITAMIN) tablet, Take 1 tablet by mouth daily., Disp: , Rfl:  .  amoxicillin (AMOXIL) 875 MG tablet, Take 1 tablet (875 mg total) by mouth 2 (two) times daily. (Patient not taking: Reported on 03/10/2018), Disp: 20 tablet, Rfl: 0  Review of Systems  Constitutional: Positive for activity change.  Cardiovascular: Negative for leg swelling.  Musculoskeletal: Positive for arthralgias, back pain and myalgias.  Neurological: Negative.  Negative for numbness.    Social History   Tobacco Use  . Smoking status: Former Smoker    Last attempt to quit: 11/16/2014    Years since quitting: 3.3  . Smokeless tobacco: Never Used  Substance Use Topics  . Alcohol use: No    Alcohol/week: 0.0 oz   Objective:   BP  132/78 (BP Location: Right Arm, Patient Position: Sitting, Cuff Size: Normal)   Temp 97.9 F (36.6 C)   Resp 16  Vitals:   03/10/18 0937  BP: 132/78  Resp: 16  Temp: 97.9 F (36.6 C)     Physical Exam  Constitutional: She is oriented to person, place, and time. She appears well-developed and well-nourished. No distress.  HENT:  Head: Normocephalic and atraumatic.  Right Ear: Hearing normal.  Left Ear: Hearing normal.  Nose: Nose normal.  Eyes: Conjunctivae and lids are normal. Right eye exhibits no discharge. Left eye exhibits no discharge. No scleral icterus.  Neck: Neck supple.  Cardiovascular: Normal rate.  Pulmonary/Chest: Effort normal and breath sounds normal. No respiratory distress.  Abdominal: Soft.  Musculoskeletal: She exhibits tenderness.  Spasms with SLR's to 90 degrees. Pain in right lower back radiating to the hip and posterior/lateral thigh. DTR's diminished on the right.  Neurological: She is alert and oriented to person, place, and time.  Skin: Skin is intact. No lesion and no rash noted.  Psychiatric: She has a normal mood and affect. Her speech is normal and behavior is normal. Thought content normal.      Assessment & Plan:     1. Acute right-sided low back pain with right-sided sciatica Sharp pains/spasms in right lower back into the hip and  lateral thigh over the past 3 weeks. 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 Out of work for 3 days and placed on muscle relaxant with prednisone. Hold Ibuprofen (had been using 12 tablets daily) and may use moist heat or ice packs prn. Continue TENS unit and/or Aspercreme with Lidocaine. Recheck in a week if no better. - predniSONE (DELTASONE) 10 MG tablet; Take 1 tablet (10 mg total) by mouth daily with breakfast. Taper down by one tablet daily (6,5,4,3,2,1)  Dispense: 21 tablet; Refill: 0 - cyclobenzaprine (FLEXERIL) 5 MG tablet; Take 1 tablet (5 mg total) by mouth 3 (three) times daily as needed for  muscle spasms.  Dispense: 30 tablet; Refill: [place], [facility]

## 2018-03-10 NOTE — Patient Instructions (Signed)
Sciatica Sciatica is pain, numbness, weakness, or tingling along the path of the sciatic nerve. The sciatic nerve starts in the lower back and runs down the back of each leg. The nerve controls the muscles in the lower leg and in the back of the knee. It also provides feeling (sensation) to the back of the thigh, the lower leg, and the sole of the foot. Sciatica is a symptom of another medical condition that pinches or puts pressure on the sciatic nerve. Generally, sciatica only affects one side of the body. Sciatica usually goes away on its own or with treatment. In some cases, sciatica may keep coming back (recur). What are the causes? This condition is caused by pressure on the sciatic nerve, or pinching of the sciatic nerve. This may be the result of:  A disk in between the bones of the spine (vertebrae) bulging out too far (herniated disk).  Age-related changes in the spinal disks (degenerative disk disease).  A pain disorder that affects a muscle in the buttock (piriformis syndrome).  Extra bone growth (bone spur) near the sciatic nerve.  An injury or break (fracture) of the pelvis.  Pregnancy.  Tumor (rare).  What increases the risk? The following factors may make you more likely to develop this condition:  Playing sports that place pressure or stress on the spine, such as football or weight lifting.  Having poor strength and flexibility.  A history of back injury.  A history of back surgery.  Sitting for long periods of time.  Doing activities that involve repetitive bending or lifting.  Obesity.  What are the signs or symptoms? Symptoms can vary from mild to very severe, and they may include:  Any of these problems in the lower back, leg, hip, or buttock: ? Mild tingling or dull aches. ? Burning sensations. ? Sharp pains.  Numbness in the back of the calf or the sole of the foot.  Leg weakness.  Severe back pain that makes movement difficult.  These  symptoms may get worse when you cough, sneeze, or laugh, or when you sit or stand for long periods of time. Being overweight may also make symptoms worse. In some cases, symptoms may recur over time. How is this diagnosed? This condition may be diagnosed based on:  Your symptoms.  A physical exam. Your health care provider may ask you to do certain movements to check whether those movements trigger your symptoms.  You may have tests, including: ? Blood tests. ? X-rays. ? MRI. ? CT scan.  How is this treated? In many cases, this condition improves on its own, without any treatment. However, treatment may include:  Reducing or modifying physical activity during periods of pain.  Exercising and stretching to strengthen your abdomen and improve the flexibility of your spine.  Icing and applying heat to the affected area.  Medicines that help: ? To relieve pain and swelling. ? To relax your muscles.  Injections of medicines that help to relieve pain, irritation, and inflammation around the sciatic nerve (steroids).  Surgery.  Follow these instructions at home: Medicines  Take over-the-counter and prescription medicines only as told by your health care provider.  Do not drive or operate heavy machinery while taking prescription pain medicine. Managing pain  If directed, apply ice to the affected area. ? Put ice in a plastic bag. ? Place a towel between your skin and the bag. ? Leave the ice on for 20 minutes, 2-3 times a day.  After icing, apply   heat to the affected area before you exercise or as often as told by your health care provider. Use the heat source that your health care provider recommends, such as a moist heat pack or a heating pad. ? Place a towel between your skin and the heat source. ? Leave the heat on for 20-30 minutes. ? Remove the heat if your skin turns bright red. This is especially important if you are unable to feel pain, heat, or cold. You may have a  greater risk of getting burned. Activity  Return to your normal activities as told by your health care provider. Ask your health care provider what activities are safe for you. ? Avoid activities that make your symptoms worse.  Take brief periods of rest throughout the day. Resting in a lying or standing position is usually better than sitting to rest. ? When you rest for longer periods, mix in some mild activity or stretching between periods of rest. This will help to prevent stiffness and pain. ? Avoid sitting for long periods of time without moving. Get up and move around at least one time each hour.  Exercise and stretch regularly, as told by your health care provider.  Do not lift anything that is heavier than 10 lb (4.5 kg) while you have symptoms of sciatica. When you do not have symptoms, you should still avoid heavy lifting, especially repetitive heavy lifting.  When you lift objects, always use proper lifting technique, which includes: ? Bending your knees. ? Keeping the load close to your body. ? Avoiding twisting. General instructions  Use good posture. ? Avoid leaning forward while sitting. ? Avoid hunching over while standing.  Maintain a healthy weight. Excess weight puts extra stress on your back and makes it difficult to maintain good posture.  Wear supportive, comfortable shoes. Avoid wearing high heels.  Avoid sleeping on a mattress that is too soft or too hard. A mattress that is firm enough to support your back when you sleep may help to reduce your pain.  Keep all follow-up visits as told by your health care provider. This is important. Contact a health care provider if:  You have pain that wakes you up when you are sleeping.  You have pain that gets worse when you lie down.  Your pain is worse than you have experienced in the past.  Your pain lasts longer than 4 weeks.  You experience unexplained weight loss. Get help right away if:  You lose control  of your bowel or bladder (incontinence).  You have: ? Weakness in your lower back, pelvis, buttocks, or legs that gets worse. ? Redness or swelling of your back. ? A burning sensation when you urinate. This information is not intended to replace advice given to you by your health care provider. Make sure you discuss any questions you have with your health care provider. Document Released: 12/11/2001 Document Revised: 05/22/2016 Document Reviewed: 08/26/2015 Elsevier Interactive Patient Education  2018 Elsevier Inc.  

## 2018-07-10 ENCOUNTER — Encounter: Payer: Self-pay | Admitting: Family Medicine

## 2018-07-10 ENCOUNTER — Ambulatory Visit: Payer: BC Managed Care – PPO | Admitting: Family Medicine

## 2018-07-10 VITALS — BP 130/84 | HR 73 | Temp 98.4°F | Wt 272.2 lb

## 2018-07-10 DIAGNOSIS — E039 Hypothyroidism, unspecified: Secondary | ICD-10-CM

## 2018-07-10 DIAGNOSIS — Z1231 Encounter for screening mammogram for malignant neoplasm of breast: Secondary | ICD-10-CM

## 2018-07-10 DIAGNOSIS — E669 Obesity, unspecified: Secondary | ICD-10-CM | POA: Diagnosis not present

## 2018-07-10 DIAGNOSIS — I83893 Varicose veins of bilateral lower extremities with other complications: Secondary | ICD-10-CM

## 2018-07-10 MED ORDER — FUROSEMIDE 20 MG PO TABS: 20.0000 mg | ORAL_TABLET | Freq: Every day | ORAL | 3 refills | Status: DC

## 2018-07-10 NOTE — Progress Notes (Signed)
Patient: Linda Rogers Female    DOB: April 26, 1976   42 y.o.   MRN: [id number] Visit Date: 07/10/2018  Today's Provider: Vernie Murders, PA   Chief Complaint  Patient presents with  . Leg Swelling   Subjective:    HPI Patient presents today C/O leg swelling. Patient states the symptoms started approximately 3-4 weeks ago and is gradually worsening. She states she has tried OTC diuretic and prescription fluid medication. She states the prescription medication helped some.    Past Medical History:  Diagnosis Date  . Cancer ([place])    prev cancerous cells uterus   Patient Active Problem List   Diagnosis Date Noted  . Depression, major, single episode, moderate ([place]) 05/16/2017  . 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 Cerebral artery occlusion with cerebral infarction ([place]) 06/15/2015  . Adult hypothyroidism 06/15/2015  . Migraine 06/15/2015   Past Surgical History:  Procedure Laterality Date  . ABDOMINAL HYSTERECTOMY  2005  . APPENDECTOMY  1987  . TONSILLECTOMY AND ADENOIDECTOMY     Family History  Problem Relation Age of Onset  . Ovarian cancer Mother   . Colon polyps Mother   . Breast cancer Neg Hx    No Known Allergies  Current Outpatient Medications:  .  cyclobenzaprine (FLEXERIL) 5 MG tablet, Take 1 tablet (5 mg total) by mouth 3 (three) times daily as needed for muscle spasms., Disp: 30 tablet, Rfl: 1 .  Fexofenadine HCl (ALLEGRA ALLERGY PO), Take by mouth., Disp: , Rfl:  .  ibuprofen (ADVIL,MOTRIN) 200 MG tablet, Take by mouth as needed., Disp: , Rfl:  .  levothyroxine (SYNTHROID, LEVOTHROID) 75 MCG tablet, TAKE 1 TABLET(75 MCG) BY MOUTH DAILY, Disp: 90 tablet, Rfl: 3 .  Multiple Vitamin (MULTIVITAMIN) tablet, Take 1 tablet by mouth daily., Disp: , Rfl:   Review of Systems  Constitutional: Negative.   Respiratory: Negative.   Cardiovascular: Positive for leg swelling.   Social History   Tobacco Use  . Smoking status: Former Smoker     Last attempt to quit: 11/16/2014    Years since quitting: 3.6  . Smokeless tobacco: Never Used  Substance Use Topics  . Alcohol use: No    Alcohol/week: 0.0 oz   Objective:   BP 130/84 (BP Location: Right Arm, Patient Position: Sitting, Cuff Size: Large)   Pulse 73   Temp 98.4 F (36.9 C) (Oral)   Wt 272 lb 3.2 oz (123.5 kg)   SpO2 97%   BMI 37.96 kg/m  Wt Readings from Last 3 Encounters:  07/10/18 272 lb 3.2 oz (123.5 kg)  12/30/17 267 lb 9.6 oz (121.4 kg)  11/27/17 260 lb (117.9 kg)   Physical Exam  Constitutional: She is oriented to person, place, and time. She appears well-developed and well-nourished. No distress.  HENT:  Head: Normocephalic and atraumatic.  Right Ear: Hearing normal.  Left Ear: Hearing normal.  Nose: Nose normal.  Eyes: Conjunctivae and lids are normal. Right eye exhibits no discharge. Left eye exhibits no discharge. No scleral icterus.  Neck: Normal range of motion. Neck supple. No JVD present.  Cardiovascular: Normal rate, regular rhythm, normal heart sounds and intact distal pulses.  Many varicose veins in both legs.   Pulmonary/Chest: Effort normal. No respiratory distress.  Abdominal: Soft. Bowel sounds are normal.  Musculoskeletal: Normal range of motion. She exhibits no edema.  Neurological: She is alert and oriented to person, place, and time.  Skin: Skin is intact. No lesion and no rash  noted.  Psychiatric: She has a normal mood and affect. Her speech is normal and behavior is normal. Thought content normal.      Assessment & Plan:     1. Varicose veins of both legs with edema States she had vein stripping in the right leg many years ago. Family history positive for venous insufficiency with very large varicose veins in grandfather. Recent swelling in both feet improved after she took a diuretic. Denies significant salt/sodium intake. Does a lot of walking on her job and notices swelling by the end of the day - better each morning. Many  varicose veins noted. Recommend she use support hose and given Lasix to use prn. Check routine labs and follow up pending reports. - CBC with Differential/Platelet - Comprehensive metabolic panel - furosemide (LASIX) 20 MG tablet; Take 1 tablet (20 mg total) by mouth daily.  Dispense: 30 tablet; Refill: 3  2. Adult hypothyroidism Presently on Levothyroxine 75 mcg qd. No thyroid surgery or nodules present. Notices some occasional palpitations and edema. Recheck labs. - CBC with Differential/Platelet - T4 - TSH - Lipid panel  3. Obesity, Class II, BMI 35-39.9 Has gained 12 lbs since 11-27-18. History of bariatric evaluation and treatment with appetite suppressant with diet. Encouraged to work on diet and exercise. Check labs to rule out diabetes or metabolic disorder. - CBC with Differential/Platelet - Comprehensive metabolic panel - Hemoglobin A1c - T4 - TSH - Lipid panel  4. Breast cancer screening by mammogram Denies masses and negative family history for breast cancer. Patient has had cervical cancer and abdominal hysterectomy. - MM Digital Screening       Vernie Murders, PA  [facility]

## 2018-07-11 ENCOUNTER — Other Ambulatory Visit
Admission: RE | Admit: 2018-07-11 | Discharge: 2018-07-11 | Disposition: A | Discharge status: Home / Self Care | Payer: BC Managed Care – PPO | Source: Ambulatory Visit | Attending: Family Medicine | Admitting: Family Medicine

## 2018-07-11 DIAGNOSIS — I83893 Varicose veins of bilateral lower extremities with other complications: Secondary | ICD-10-CM | POA: Diagnosis not present

## 2018-07-11 DIAGNOSIS — E039 Hypothyroidism, unspecified: Secondary | ICD-10-CM | POA: Diagnosis present

## 2018-07-11 DIAGNOSIS — E669 Obesity, unspecified: Secondary | ICD-10-CM | POA: Insufficient documentation

## 2018-07-11 LAB — COMPREHENSIVE METABOLIC PANEL
ALK PHOS: 49 U/L (ref 38–126)
ALT: 14 U/L (ref 0–44)
AST: 18 U/L (ref 15–41)
Albumin: 4 g/dL (ref 3.5–5.0)
Anion gap: 11 (ref 5–15)
BILIRUBIN TOTAL: 0.8 mg/dL (ref 0.3–1.2)
BUN: 16 mg/dL (ref 6–20)
CALCIUM: 9 mg/dL (ref 8.9–10.3)
CO2: 26 mmol/L (ref 22–32)
Chloride: 103 mmol/L (ref 98–111)
Creatinine, Ser: 0.8 mg/dL (ref 0.44–1.00)
GFR calc non Af Amer: >60 mL/min (ref >60)
Glucose, Bld: 110 mg/dL — ABNORMAL HIGH (ref 70–99)
Potassium: 4.1 mmol/L (ref 3.5–5.1)
SODIUM: 140 mmol/L (ref 135–145)
TOTAL PROTEIN: 7 g/dL (ref 6.5–8.1)

## 2018-07-11 LAB — CBC WITH DIFFERENTIAL/PLATELET
Basophils Absolute: 0.1 10*3/uL (ref 0–0.1)
Basophils Relative: 1 %
EOS ABS: 0.1 10*3/uL (ref 0–0.7)
Eosinophils Relative: 2 %
HCT: 41.4 % (ref 35.0–47.0)
HEMOGLOBIN: 13.8 g/dL (ref 12.0–16.0)
LYMPHS ABS: 2.2 10*3/uL (ref 1.0–3.6)
LYMPHS PCT: 33 %
MCH: 31.3 pg (ref 26.0–34.0)
MCHC: 33.4 g/dL (ref 32.0–36.0)
MCV: 93.8 fL (ref 80.0–100.0)
MONOS PCT: 6 %
Monocytes Absolute: 0.4 10*3/uL (ref 0.2–0.9)
NEUTROS ABS: 4 10*3/uL (ref 1.4–6.5)
NEUTROS PCT: 58 %
Platelets: 187 10*3/uL (ref 150–440)
RBC: 4.41 MIL/uL (ref 3.80–5.20)
RDW: 13.7 % (ref 11.5–14.5)
WBC: 6.8 10*3/uL (ref 3.6–11.0)

## 2018-07-11 LAB — TSH: TSH: 6.22 u[IU]/mL — AB (ref 0.350–4.500)

## 2018-07-11 LAB — LIPID PANEL
CHOLESTEROL: 201 mg/dL — AB (ref 0–200)
HDL: 43 mg/dL (ref >40)
LDL Cholesterol: 126 mg/dL — ABNORMAL HIGH (ref 0–99)
Total CHOL/HDL Ratio: 4.7 RATIO
Triglycerides: 160 mg/dL — ABNORMAL HIGH (ref <150)
VLDL: 32 mg/dL (ref 0–40)

## 2018-07-11 LAB — HEMOGLOBIN A1C
HEMOGLOBIN A1C: 5.4 % (ref 4.8–5.6)
MEAN PLASMA GLUCOSE: 108.28 mg/dL

## 2018-07-12 LAB — T4: T4, Total: 6.1 ug/dL (ref 4.5–12.0)

## 2018-07-16 ENCOUNTER — Telehealth: Payer: Self-pay

## 2018-07-16 MED ORDER — LEVOTHYROXINE SODIUM 88 MCG PO TABS: 88.0000 ug | ORAL_TABLET | Freq: Every day | ORAL | 3 refills | Status: DC

## 2018-07-16 NOTE — Telephone Encounter (Signed)
-----   Message from Margo [place], [place] sent at 07/14/2018 12:32 PM EDT ----- Normal hgb A1C without signs of diabetes. Final thyroid blood test normal. Slight fluctuation in TSH level. Increase Levothyroxine to 88 mcg qd #90 & 3 RF then recheck weight and blood levels in 3 months.

## 2018-07-16 NOTE — Telephone Encounter (Signed)
Pt advised. RX sent to [facility] in [place].   Thanks,   -Mickel Baas

## 2018-08-14 IMAGING — CR DG LUMBAR SPINE COMPLETE 4+V
1 series · 5 of 5 positions shown · non-contrast
Comparison: None.

CLINICAL DATA: Left lower back pain starting November 2016, left
knee pain

EXAM:
LUMBAR SPINE - COMPLETE 4+ VIEW

[Series 1: dg lumbar spine complete 4 +v · 0.14mm/px · 5 of 5 slices shown]
[im 1/5]
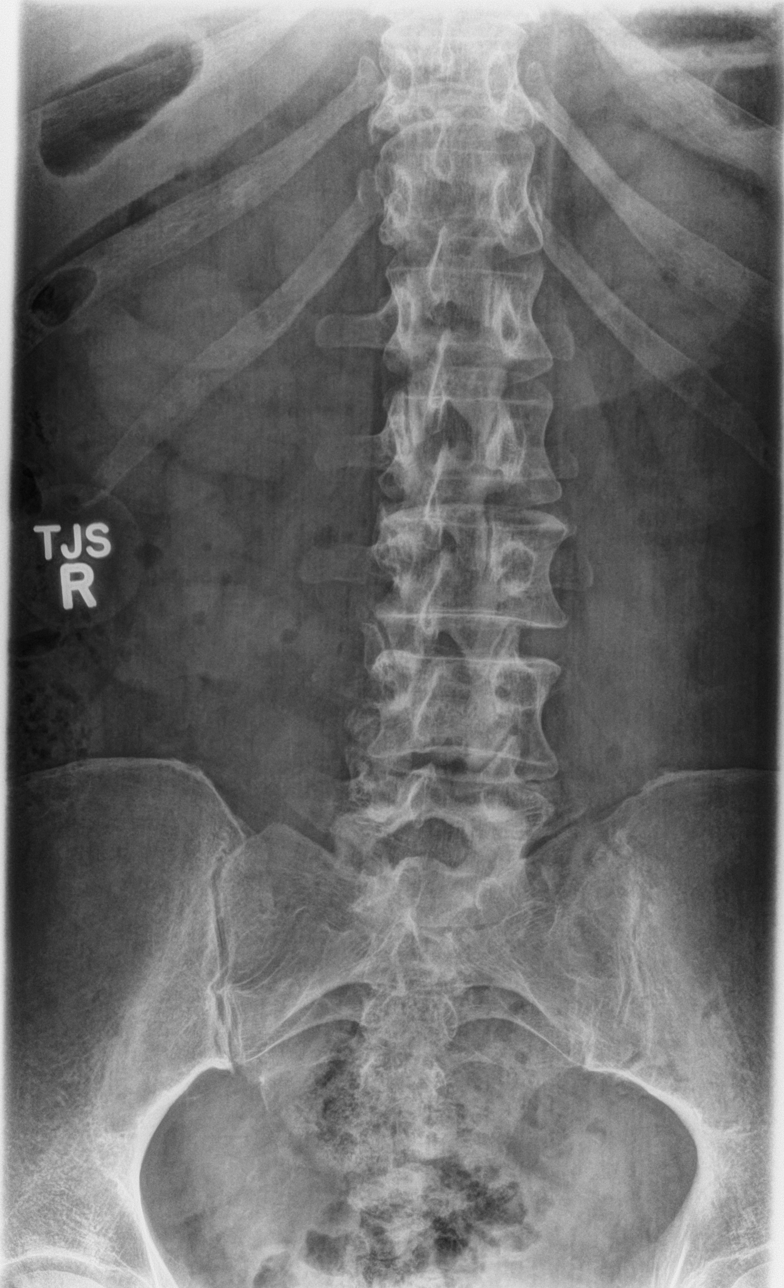
[im 2/5]
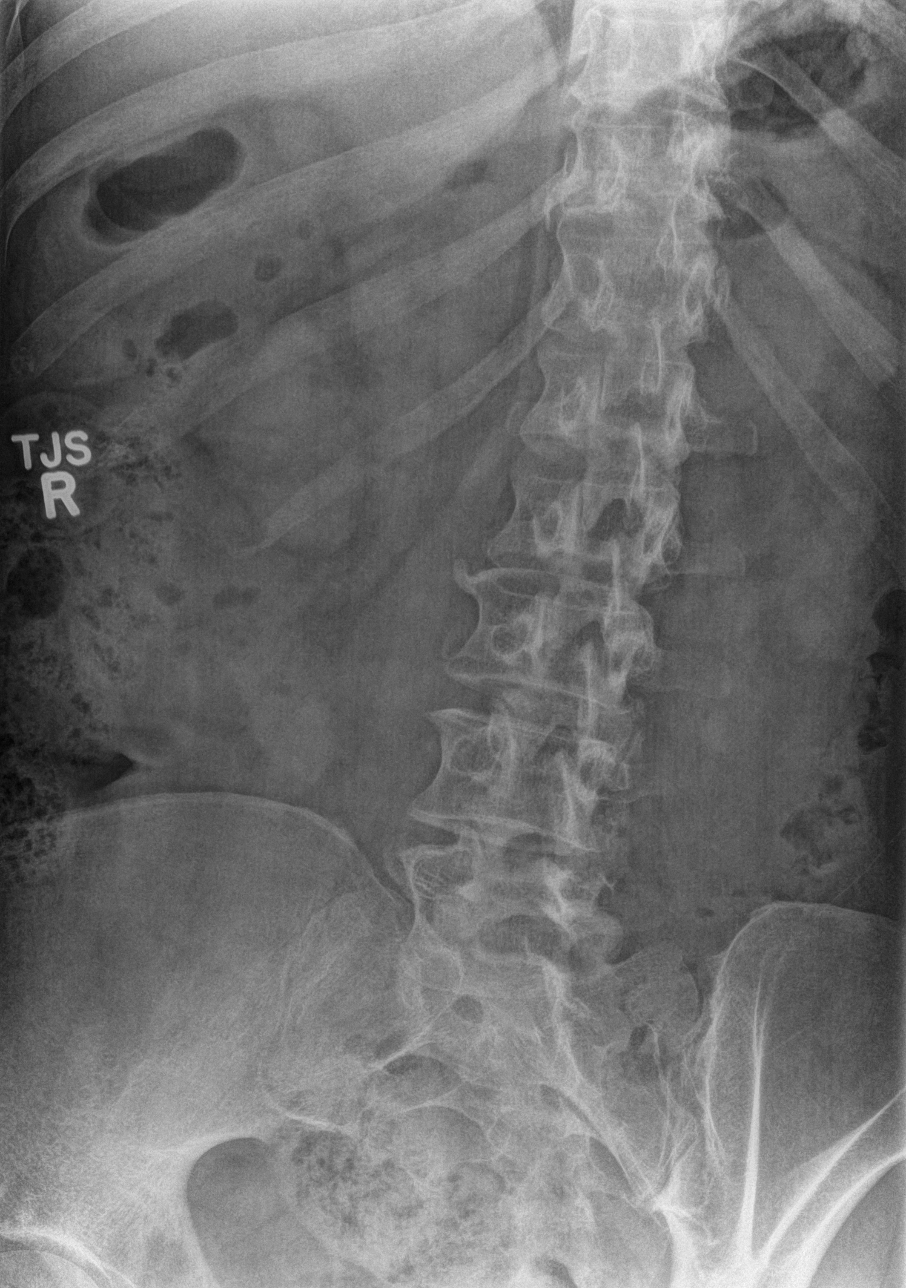
[im 3/5]
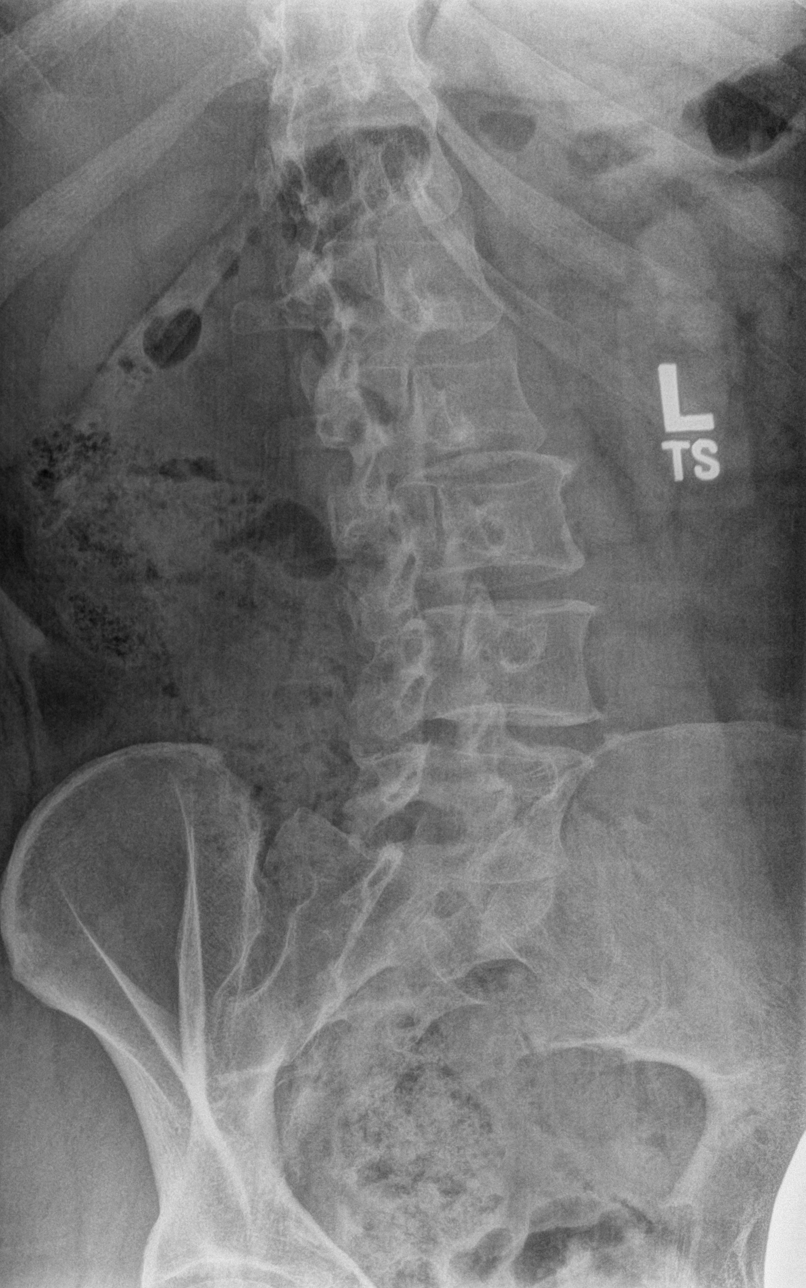
[im 4/5]
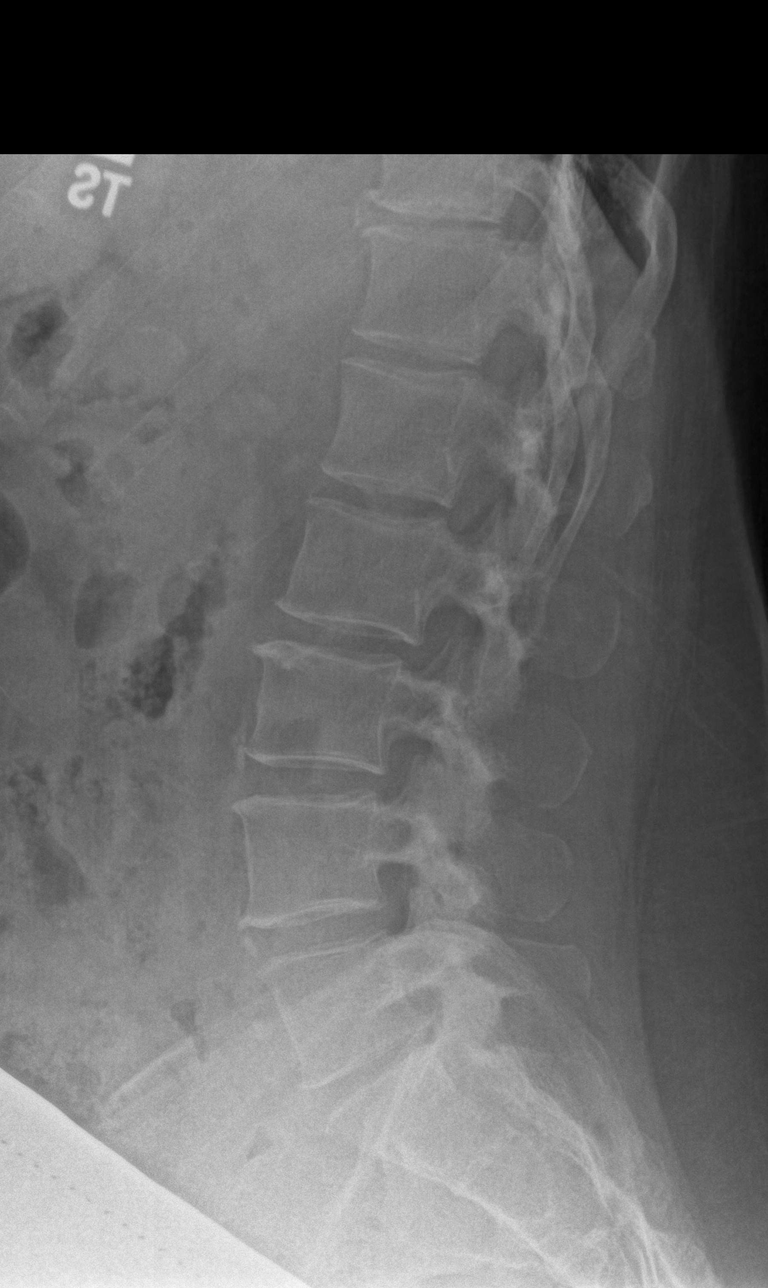
[im 5/5]
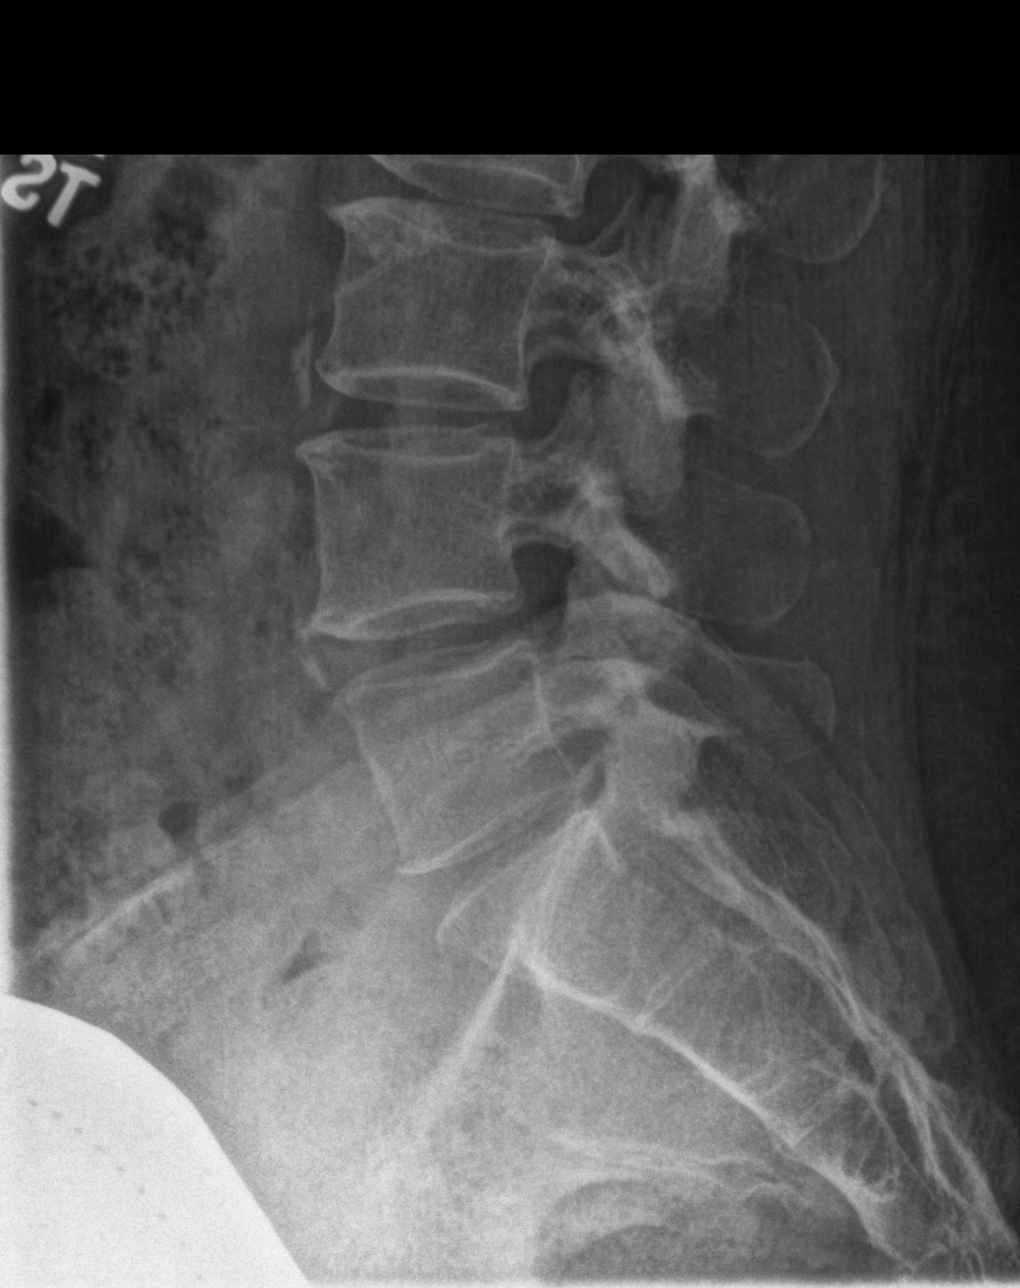

[5 of 5 positions shown; findings below may reference images not displayed]

FINDINGS: Five views of the lumbar spine submitted. No acute fracture or
subluxation. Alignment and vertebral body heights are preserved.
Minimal disc space flattening with anterior spurring at T11-T12
level. Mild anterior spurring upper endplate of L3 vertebral body.
Minimal anterior spurring upper and lower endplate of L4 vertebral
body. Mild disc space flattening at L5-S1 level.
IMPRESSION: No acute fracture or subluxation. Mild degenerative changes as
described above.

## 2018-12-01 ENCOUNTER — Ambulatory Visit
Admission: RE | Admit: 2018-12-01 | Discharge: 2018-12-01 | Disposition: A | Discharge status: Home / Self Care | Payer: BC Managed Care – PPO | Source: Ambulatory Visit | Attending: Family Medicine | Admitting: Family Medicine

## 2018-12-01 DIAGNOSIS — Z1231 Encounter for screening mammogram for malignant neoplasm of breast: Secondary | ICD-10-CM | POA: Insufficient documentation

## 2018-12-02 ENCOUNTER — Telehealth: Payer: Self-pay | Admitting: *Deleted

## 2018-12-02 NOTE — Telephone Encounter (Signed)
Pt returned missed call.  Please call pt back. ° °Thanks, °[facility] °

## 2018-12-02 NOTE — Telephone Encounter (Signed)
-----   Message from Margo Common, [place] sent at 12/01/2018  5:31 PM EST ----- Normal mammograms. Repeat screening in a year.

## 2018-12-02 NOTE — Telephone Encounter (Signed)
LMOVM for pt to return call 

## 2018-12-03 NOTE — Telephone Encounter (Signed)
Patient advised.KW 

## 2019-01-13 ENCOUNTER — Telehealth: Payer: Self-pay

## 2019-01-13 ENCOUNTER — Ambulatory Visit
Admission: RE | Admit: 2019-01-13 | Discharge: 2019-01-13 | Disposition: A | Discharge status: Home / Self Care | Payer: BC Managed Care – PPO | Source: Ambulatory Visit | Attending: Family Medicine | Admitting: Family Medicine

## 2019-01-13 ENCOUNTER — Encounter: Payer: Self-pay | Admitting: Family Medicine

## 2019-01-13 ENCOUNTER — Ambulatory Visit: Payer: BC Managed Care – PPO | Admitting: Family Medicine

## 2019-01-13 ENCOUNTER — Ambulatory Visit
Admission: RE | Admit: 2019-01-13 | Discharge: 2019-01-13 | Disposition: A | Discharge status: Home / Self Care | Payer: BC Managed Care – PPO | Attending: Family Medicine | Admitting: Family Medicine

## 2019-01-13 VITALS — BP 122/70 | HR 85 | Temp 98.2°F | Resp 16 | Wt 260.2 lb

## 2019-01-13 DIAGNOSIS — M25562 Pain in left knee: Secondary | ICD-10-CM

## 2019-01-13 NOTE — Telephone Encounter (Signed)
-----   Message from [place], [place] sent at 01/13/2019  4:12 PM EST ----- Some minimal joint space narrowing that usually indicates early arthritis and questionable small effusion (fluid collection). No bony abnormality or fracture. Use knee supporter for comfort and take Ibuprofen as discussed. Recheck if no better in 2 weeks.

## 2019-01-13 NOTE — Telephone Encounter (Signed)
Patient advised as directed below. 

## 2019-01-13 NOTE — Progress Notes (Signed)
Patient: Linda Rogers Female    DOB: 12/17/1976   43 y.o.   MRN: [id number] Visit Date: 01/13/2019  Today's Provider: Vernie Murders, PA   Chief Complaint  Patient presents with  . Knee Pain   Subjective:     Knee Pain   The incident occurred 5 to 7 days ago. The incident occurred at home (running after grandson who is 2 years ago). The injury mechanism was an eversion injury. The pain is present in the left knee. The quality of the pain is described as aching and shooting. The pain is at a severity of 2/10. The pain is moderate. Associated symptoms include an inability to bear weight (" in the morning"). Pertinent negatives include no muscle weakness, numbness or tingling. She reports no foreign bodies present. She has tried ice, NSAIDs and elevation for the symptoms. The treatment provided no relief.   Past Medical History:  Diagnosis Date  . 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 ABDOMINAL HYSTERECTOMY  2005  . APPENDECTOMY  1987  . TONSILLECTOMY AND ADENOIDECTOMY     Family History  Problem Relation Age of Onset  . Ovarian cancer Mother   . Colon polyps Mother   . Breast cancer Neg Hx    No Known Allergies  Current Outpatient Medications:  .  ibuprofen (ADVIL,MOTRIN) 200 MG tablet, Take by mouth as needed., Disp: , Rfl:  .  levothyroxine (SYNTHROID, LEVOTHROID) 88 MCG tablet, Take 1 tablet (88 mcg total) by mouth daily., Disp: 90 tablet, Rfl: 3 .  cyclobenzaprine (FLEXERIL) 5 MG tablet, Take 1 tablet (5 mg total) by mouth 3 (three) times daily as needed for muscle spasms. (Patient not taking: Reported on 01/13/2019), Disp: 30 tablet, Rfl: 1 .  Fexofenadine HCl (ALLEGRA ALLERGY PO), Take by mouth., Disp: , Rfl:  .  furosemide (LASIX) 20 MG tablet, Take 1 tablet (20 mg total) by mouth daily. (Patient not taking: Reported on 01/13/2019), Disp: 30 tablet, Rfl: 3 .  Multiple Vitamin (MULTIVITAMIN)  tablet, Take 1 tablet by mouth daily., Disp: , Rfl:   Review of Systems  Constitutional: Negative.   Musculoskeletal: Positive for arthralgias.       Left knee pain and swelling.  Neurological: Negative for tingling and numbness.   Social History   Tobacco Use  . Smoking status: Former Smoker    Last attempt to quit: 11/16/2014    Years since quitting: 4.1  . Smokeless tobacco: Never Used  Substance Use Topics  . Alcohol use: No    Alcohol/week: 0.0 standard drinks     Objective:   BP 122/70 (BP Location: Right Arm, Patient Position: Sitting, Cuff Size: Large)   Pulse 85   Temp 98.2 F (36.8 C) (Oral)   Resp 16   Wt 260 lb 3.2 oz (118 kg)   SpO2 98%   BMI 36.29 kg/m  Vitals:   01/13/19 0900  BP: 122/70  Pulse: 85  Resp: 16  Temp: 98.2 F (36.8 C)  TempSrc: Oral  SpO2: 98%  Weight: 260 lb 3.2 oz (118 kg)   Physical Exam Constitutional:      General: She is not in acute distress.    Appearance: She is well-developed.  HENT:     Head: Normocephalic and atraumatic.     Right Ear: Hearing normal.     Left Ear: Hearing normal.     Nose: Nose normal.  Eyes:     General: Lids are normal. No scleral icterus.       Right eye: No discharge.        Left eye: No discharge.     Conjunctiva/sclera: Conjunctivae normal.  Pulmonary:     Effort: Pulmonary effort is normal. No respiratory distress.  Musculoskeletal:     Comments: Tenderness in the lateral knee joint line with testing collateral ligaments. Slightly puffy and sharper pain with extended 10 degrees short of full extension.   Skin:    Findings: No lesion or rash.  Neurological:     Mental Status: She is alert and oriented to person, place, and time.  Psychiatric:        Speech: Speech normal.        Behavior: Behavior normal.        Thought Content: Thought content normal.       Assessment & Plan    1. Acute pain of left knee Ran after a 76 year old grandson and hyperextended the left knee on 01-10-19.  Some slight swelling and tenderness laterally at the joint line. No crepitus. Will get x-ray evaluation and may use knee supporter with patellar window. Take Ibuprofen 200 mg 3 tablets TID and apply ice pack prn. Follow up pending x-ray report. - DG Knee Complete 4 Views Left     Vernie Murders, PA  [facility]

## 2019-02-02 NOTE — Progress Notes (Signed)
Patient: Linda Rogers Female    DOB: December 09, 1976   43 y.o.   MRN: [id number] Visit Date: 02/03/2019  Today's Provider: Vernie Murders, PA   Chief Complaint  Patient presents with  . Follow-up  . Knee Pain   Subjective:     HPI   Acute pain of left knee From 01/13/2019-x-ray evaluation done showing: Some minimal joint space narrowing that usually indicates early arthritis and questionable small effusion (fluid collection). No bony abnormality or fracture. Use knee supporter for comfort and take Ibuprofen as discussed. Recheck if no better in 2 weeks.  Patient states her left knee is not any better.   Past Medical History:  Diagnosis Date  . 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 ABDOMINAL HYSTERECTOMY  2005  . APPENDECTOMY  1987  . TONSILLECTOMY AND ADENOIDECTOMY     Family History  Problem Relation Age of Onset  . Ovarian cancer Mother   . Colon polyps Mother   . Breast cancer Neg Hx    No Known Allergies  Current Outpatient Medications:  .  cyclobenzaprine (FLEXERIL) 5 MG tablet, Take 1 tablet (5 mg total) by mouth 3 (three) times daily as needed for muscle spasms., Disp: 30 tablet, Rfl: 1 .  ELDERBERRY PO, Take by mouth., Disp: , Rfl:  .  Fexofenadine HCl (ALLEGRA ALLERGY PO), Take by mouth., Disp: , Rfl:  .  furosemide (LASIX) 20 MG tablet, Take 1 tablet (20 mg total) by mouth daily., Disp: 30 tablet, Rfl: 3 .  ibuprofen (ADVIL,MOTRIN) 200 MG tablet, Take by mouth as needed., Disp: , Rfl:  .  levothyroxine (SYNTHROID, LEVOTHROID) 88 MCG tablet, Take 1 tablet (88 mcg total) by mouth daily., Disp: 90 tablet, Rfl: 3 .  Multiple Vitamin (MULTIVITAMIN) tablet, Take 1 tablet by mouth daily., Disp: , Rfl:   Review of Systems  Constitutional: Negative for appetite change, chills, fatigue and fever.  Respiratory: Negative for chest tightness and shortness of breath.   Cardiovascular: Negative for  chest pain and palpitations.  Gastrointestinal: Negative for abdominal pain, nausea and vomiting.  Neurological: Negative for dizziness and weakness.   Social History   Tobacco Use  . Smoking status: Former Smoker    Last attempt to quit: 11/16/2014    Years since quitting: 4.2  . Smokeless tobacco: Never Used  Substance Use Topics  . Alcohol use: No    Alcohol/week: 0.0 standard drinks     Objective:   BP 98/60 (BP Location: Right Arm, Patient Position: Sitting, Cuff Size: Large)   Pulse 92   Temp 98 F (36.7 C) (Oral)   Resp 16   Wt 262 lb (118.8 kg)   SpO2 98%   BMI 36.54 kg/m  Vitals:   02/03/19 1329  BP: 98/60  Pulse: 92  Resp: 16  Temp: 98 F (36.7 C)  TempSrc: Oral  SpO2: 98%  Weight: 262 lb (118.8 kg)   Physical Exam Constitutional:      General: She is not in acute distress.    Appearance: She is well-developed.  HENT:     Head: Normocephalic and atraumatic.     Right Ear: Hearing normal.     Left Ear: Hearing normal.     Nose: Nose normal.  Eyes:     General: Lids are normal. No scleral icterus.       Right eye: No discharge.  Left eye: No discharge.     Conjunctiva/sclera: Conjunctivae normal.  Pulmonary:     Effort: Pulmonary effort is normal. No respiratory distress.  Musculoskeletal:     Comments: Pain in the medial side of the left knee with fair ROM. Sharper pain to flex knee beyond 90 degrees and has a full/tight sensation of having a "ball" in the popliteal fossa. Some popping with stabbing pain occasionally with twisting/rotation.  Skin:    Findings: No lesion or rash.  Neurological:     Mental Status: She is alert and oriented to person, place, and time.  Psychiatric:        Speech: Speech normal.        Behavior: Behavior normal.        Thought Content: Thought content normal.      Assessment & Plan    1. Acute pain of left knee Onset 01-10-19 when she hyperextended the left knee chasing a 69 year old grandson. Tried  Ibuprofen 200 mg 3 tablets TID with food and a knee brace for support. X-ray on 01-13-19 showed medial joint space narrowing and small effusion without bony abnormality. Still having popping and pain with sensation of a ball in the popliteal fossa. Requests orthopedic referral before getting MRI scan. Suspect cartilage tear with a possible Baker's Cyst. Continue brace and NSAID while scheduling orthopedic referral. - Ambulatory referral to [facility]

## 2019-02-03 ENCOUNTER — Encounter: Payer: Self-pay | Admitting: Family Medicine

## 2019-02-03 ENCOUNTER — Ambulatory Visit: Payer: BC Managed Care – PPO | Admitting: Family Medicine

## 2019-02-03 VITALS — BP 98/60 | HR 92 | Temp 98.0°F | Resp 16 | Wt 262.0 lb

## 2019-02-03 DIAGNOSIS — M25562 Pain in left knee: Secondary | ICD-10-CM

## 2019-05-13 ENCOUNTER — Other Ambulatory Visit: Payer: Self-pay | Admitting: Family Medicine

## 2019-08-26 ENCOUNTER — Other Ambulatory Visit: Payer: Self-pay | Admitting: Family Medicine

## 2019-08-26 DIAGNOSIS — I83893 Varicose veins of bilateral lower extremities with other complications: Secondary | ICD-10-CM

## 2019-10-15 NOTE — Progress Notes (Signed)
Patient: Linda Rogers Female    DOB: 20-Mar-1976   43 y.o.   MRN: [id number] Visit Date: 10/16/2019  Today's Provider: Vernie Murders, PA   Chief Complaint  Patient presents with  . Follow-up   Subjective:     HPI   Hypothyroid, follow-up:  TSH  Date Value Ref Range Status  07/11/2018 6.220 (H) 0.350 - 4.500 uIU/mL Final    Comment:    Performed by a 3rd Generation assay with a functional sensitivity of <=0.01 uIU/mL. Performed at [facility], [address] [place], [place] [address]   08/16/2017 6.135 (H) 0.350 - 4.500 uIU/mL Final    Comment:    Performed by a 3rd Generation assay with a functional sensitivity of <=0.01 uIU/mL.  02/25/2017 8.270 (H) 0.450 - 4.500 uIU/mL Final   Wt Readings from Last 3 Encounters:  10/16/19 279 lb 6.4 oz (126.7 kg)  02/03/19 262 lb (118.8 kg)  01/13/19 260 lb 3.2 oz (118 kg)    She was last seen for hypothyroid 3 months ago.  Management since that visit includes Increase Levothyroxine to 88 mcg qd  then recheck weight and blood levels in 3 months.. She reports excellent compliance with treatment. She is not having side effects.  She is not exercising. She is experiencing change in energy level and weight changes She denies diarrhea, nervousness and palpitations Weight trend: increasing steadily  ------------------------------------------------------------------------ Past Medical History:  Diagnosis Date  . 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 ABDOMINAL HYSTERECTOMY  2005  . APPENDECTOMY  1987  . TONSILLECTOMY AND ADENOIDECTOMY     Family History  Problem Relation Age of Onset  . Ovarian cancer Mother   . Colon polyps Mother   . Breast cancer Neg Hx    No Known Allergies  Current Outpatient Medications:  .  Fexofenadine HCl (ALLEGRA ALLERGY PO), Take by mouth., Disp: , Rfl:  .  ibuprofen (ADVIL,MOTRIN) 200 MG tablet, Take by  mouth as needed., Disp: , Rfl:  .  levothyroxine (SYNTHROID) 88 MCG tablet, TAKE (1) TABLET BY MOUTH EVERY DAY, Disp: 90 tablet, Rfl: 0  Review of Systems  Constitutional: Positive for activity change and unexpected weight change.  Cardiovascular: Negative.   Endocrine: Negative.    Social History   Tobacco Use  . Smoking status: Former Smoker    Quit date: 11/16/2014    Years since quitting: 4.9  . Smokeless tobacco: Never Used  Substance Use Topics  . Alcohol use: No    Alcohol/week: 0.0 standard drinks     Objective:   BP 120/76 (BP Location: Right Arm, Patient Position: Sitting, Cuff Size: Large)   Pulse 80   Temp (!) 97.3 F (36.3 C) (Temporal)   Resp 16   Ht 6' (1.829 m)   Wt 279 lb 6.4 oz (126.7 kg)   BMI 37.89 kg/m   Wt Readings from Last 3 Encounters:  10/16/19 279 lb 6.4 oz (126.7 kg)  02/03/19 262 lb (118.8 kg)  01/13/19 260 lb 3.2 oz (118 kg)   Vitals:   10/16/19 0811  BP: 120/76  Pulse: 80  Resp: 16  Temp: (!) 97.3 F (36.3 C)  TempSrc: Temporal  Weight: 279 lb 6.4 oz (126.7 kg)  Height: 6' (1.829 m)  Body mass index is 37.89 kg/m.  Physical Exam Constitutional:      General: She is not in acute distress.  Appearance: She is well-developed.  HENT:     Head: Normocephalic and atraumatic.     Right Ear: Hearing normal.     Left Ear: Hearing normal.     Nose: Nose normal.  Eyes:     General: Lids are normal. No scleral icterus.       Right eye: No discharge.        Left eye: No discharge.     Conjunctiva/sclera: Conjunctivae normal.  Neck:     Musculoskeletal: Neck supple.  Cardiovascular:     Rate and Rhythm: Normal rate.  Pulmonary:     Effort: Pulmonary effort is normal. No respiratory distress.  Musculoskeletal: Normal range of motion.  Skin:    Findings: No lesion or rash.  Neurological:     Mental Status: She is alert and oriented to person, place, and time.  Psychiatric:        Speech: Speech normal.        Behavior:  Behavior normal.        Thought Content: Thought content normal.       Assessment & Plan    1. Adult hypothyroidism Presently on Levothyroxine 88 mcg qd. No hyperreflexia, exophthalmos, tremor or brittle nails. Some hot flashes and night sweats with unexplained weight gain. May need increase in dosage or treatment of menopause. Check CBC, CMP, thyroid function and estrogen level. - CBC with Differential/Platelet - Comprehensive metabolic panel - T4 - TSH  2. Menopausal syndrome (hot flashes) Last menses was approximately 15 years ago prior to hysterectomy for cervical cancer. Having hot flashes and night sweats more frequently. Will check labs with estrogen level. May try herbal supplement. If worsening and low estrogen level, may need estrogen supplementation.  - CBC with Differential/Platelet - Comprehensive metabolic panel - Estrogens, total  3. Obesity, Class II, BMI 35-39.9 Gained 17 lbs since Feb. 2020. Recommend recheck of thyroid function and metabolic panel for other contributing issues. Should lower caloric intake and exercise regularly.  - CBC with Differential/Platelet - Comprehensive metabolic panel - TSH  4. Need for influenza vaccination Deferred due to steroid injections.     Vernie Murders, PA  [facility]

## 2019-10-16 ENCOUNTER — Ambulatory Visit (INDEPENDENT_AMBULATORY_CARE_PROVIDER_SITE_OTHER): Payer: BC Managed Care – PPO | Admitting: Family Medicine

## 2019-10-16 ENCOUNTER — Encounter: Payer: Self-pay | Admitting: Family Medicine

## 2019-10-16 VITALS — BP 120/76 | HR 80 | Temp 97.3°F | Resp 16 | Ht 72.0 in | Wt 279.4 lb

## 2019-10-16 DIAGNOSIS — E669 Obesity, unspecified: Secondary | ICD-10-CM

## 2019-10-16 DIAGNOSIS — Z23 Encounter for immunization: Secondary | ICD-10-CM

## 2019-10-16 DIAGNOSIS — E039 Hypothyroidism, unspecified: Secondary | ICD-10-CM

## 2019-10-16 DIAGNOSIS — N951 Menopausal and female climacteric states: Secondary | ICD-10-CM | POA: Diagnosis not present

## 2019-10-18 ENCOUNTER — Other Ambulatory Visit: Payer: Self-pay | Admitting: Family Medicine

## 2019-10-18 MED ORDER — LEVOTHYROXINE SODIUM 100 MCG PO TABS: 100.0000 ug | ORAL_TABLET | Freq: Every day | ORAL | 3 refills | Status: DC

## 2019-10-20 ENCOUNTER — Telehealth: Payer: Self-pay | Admitting: Family Medicine

## 2019-10-20 LAB — COMPREHENSIVE METABOLIC PANEL
ALT: 11 IU/L (ref 0–32)
AST: 9 IU/L (ref 0–40)
Albumin/Globulin Ratio: 1.8 (ref 1.2–2.2)
Albumin: 4.1 g/dL (ref 3.8–4.8)
Alkaline Phosphatase: 58 IU/L (ref 39–117)
BUN/Creatinine Ratio: 17 (ref 9–23)
BUN: 14 mg/dL (ref 6–24)
Bilirubin Total: 0.5 mg/dL (ref 0.0–1.2)
CO2: 23 mmol/L (ref 20–29)
Calcium: 9.2 mg/dL (ref 8.7–10.2)
Chloride: 106 mmol/L (ref 96–106)
Creatinine, Ser: 0.82 mg/dL (ref 0.57–1.00)
GFR calc Af Amer: 100 mL/min/{1.73_m2} (ref >59)
GFR calc non Af Amer: 87 mL/min/{1.73_m2} (ref >59)
Globulin, Total: 2.3 g/dL (ref 1.5–4.5)
Glucose: 85 mg/dL (ref 65–99)
Potassium: 4.4 mmol/L (ref 3.5–5.2)
Sodium: 139 mmol/L (ref 134–144)
Total Protein: 6.4 g/dL (ref 6.0–8.5)

## 2019-10-20 LAB — CBC WITH DIFFERENTIAL/PLATELET
Basophils Absolute: 0.1 10*3/uL (ref 0.0–0.2)
Basos: 1 %
EOS (ABSOLUTE): 0.1 10*3/uL (ref 0.0–0.4)
Eos: 1 %
Hematocrit: 38.3 % (ref 34.0–46.6)
Hemoglobin: 13 g/dL (ref 11.1–15.9)
Immature Grans (Abs): 0.1 10*3/uL (ref 0.0–0.1)
Immature Granulocytes: 1 %
Lymphocytes Absolute: 3 10*3/uL (ref 0.7–3.1)
Lymphs: 29 %
MCH: 31.6 pg (ref 26.6–33.0)
MCHC: 33.9 g/dL (ref 31.5–35.7)
MCV: 93 fL (ref 79–97)
Monocytes Absolute: 0.7 10*3/uL (ref 0.1–0.9)
Monocytes: 6 %
Neutrophils Absolute: 6.5 10*3/uL (ref 1.4–7.0)
Neutrophils: 62 %
Platelets: 188 10*3/uL (ref 150–450)
RBC: 4.11 x10E6/uL (ref 3.77–5.28)
RDW: 13.4 % (ref 11.7–15.4)
WBC: 10.4 10*3/uL (ref 3.4–10.8)

## 2019-10-20 LAB — T4: T4, Total: 7.6 ug/dL (ref 4.5–12.0)

## 2019-10-20 LAB — TSH: TSH: 8.19 u[IU]/mL — ABNORMAL HIGH (ref 0.450–4.500)

## 2019-10-20 LAB — ESTROGENS, TOTAL: Estrogen: 601 pg/mL

## 2019-10-20 NOTE — Telephone Encounter (Signed)
.  chr

## 2019-10-20 NOTE — Telephone Encounter (Signed)
Pt calling to get the recent labs done.  Please call pt back at [phone].   Thanks,  [employer]

## 2019-10-20 NOTE — Telephone Encounter (Signed)
See result note attached to labs of 10-16-19. Still waiting for the estrogen level report.

## 2020-01-06 ENCOUNTER — Other Ambulatory Visit: Payer: Self-pay

## 2020-01-06 DIAGNOSIS — E039 Hypothyroidism, unspecified: Secondary | ICD-10-CM

## 2020-01-07 ENCOUNTER — Other Ambulatory Visit: Payer: Self-pay

## 2020-01-07 ENCOUNTER — Other Ambulatory Visit
Admission: RE | Admit: 2020-01-07 | Discharge: 2020-01-07 | Disposition: A | Discharge status: Home / Self Care | Payer: BC Managed Care – PPO | Attending: Family Medicine | Admitting: Family Medicine

## 2020-01-07 ENCOUNTER — Telehealth: Payer: Self-pay | Admitting: *Deleted

## 2020-01-07 DIAGNOSIS — E039 Hypothyroidism, unspecified: Secondary | ICD-10-CM

## 2020-01-07 LAB — TSH: TSH: 7.14 u[IU]/mL — ABNORMAL HIGH (ref 0.350–4.500)

## 2020-01-07 MED ORDER — LEVOTHYROXINE SODIUM 112 MCG PO TABS: 112.0000 ug | ORAL_TABLET | Freq: Every day | ORAL | 3 refills | Status: DC

## 2020-01-07 NOTE — Telephone Encounter (Signed)
Patient was notified of results. Expressed understanding. Rx sent to pharmacy. 

## 2020-01-07 NOTE — Telephone Encounter (Signed)
-----   Message from Jerrol Banana., MD sent at 01/07/2020 12:55 PM EST ----- Increase Synthroid from 100 to 112 mcg daily.  See Simona Huh in 3 months

## 2020-01-24 ENCOUNTER — Other Ambulatory Visit: Payer: Self-pay | Admitting: Family Medicine

## 2020-01-24 DIAGNOSIS — I83893 Varicose veins of bilateral lower extremities with other complications: Secondary | ICD-10-CM

## 2020-03-04 ENCOUNTER — Encounter: Payer: Self-pay | Admitting: Family Medicine

## 2020-03-04 ENCOUNTER — Other Ambulatory Visit: Payer: Self-pay

## 2020-03-04 ENCOUNTER — Ambulatory Visit (INDEPENDENT_AMBULATORY_CARE_PROVIDER_SITE_OTHER): Payer: BC Managed Care – PPO | Admitting: Family Medicine

## 2020-03-04 VITALS — BP 135/83 | HR 71 | Temp 97.3°F | Resp 16 | Ht 72.0 in | Wt 286.0 lb

## 2020-03-04 DIAGNOSIS — E039 Hypothyroidism, unspecified: Secondary | ICD-10-CM

## 2020-03-04 DIAGNOSIS — L678 Other hair color and hair shaft abnormalities: Secondary | ICD-10-CM | POA: Diagnosis not present

## 2020-03-04 DIAGNOSIS — R002 Palpitations: Secondary | ICD-10-CM

## 2020-03-04 NOTE — Progress Notes (Signed)
Patient: Linda Rogers Female    DOB: Jan 30, 1976   44 y.o.   MRN: [id number] Visit Date: 03/04/2020  Today's Provider: Vernie Murders, PA   Chief complaint: Brittle hair and palpitations.  Subjective:     HPI: This 44 year old female with history of hypothyroidism has been having dry brittle hair with some palpitations. Concerned about possible low iron or too much Levothyroxine with the dosage increase to 112 mcg qd on 01-07-20 with TSH elevated at 7.140.  Patient is here to discuss fatigue, hair loss and swelling in her ankles. Patient states she noticed a change in her hair about one month ago. Patient states her hair has started to break off. Patient states her ankles have been slightly swelling for sometime. However swelling has gotten worse over the past month.  Past Medical History:  Diagnosis Date  . 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 ABDOMINAL HYSTERECTOMY  2005  . APPENDECTOMY  1987  . TONSILLECTOMY AND ADENOIDECTOMY     Family History  Problem Relation Age of Onset  . Ovarian cancer Mother   . Colon polyps Mother   . Breast cancer Neg Hx    No Known Allergies  Current Outpatient Medications:  .  Fexofenadine HCl (ALLEGRA ALLERGY PO), Take by mouth., Disp: , Rfl:  .  ibuprofen (ADVIL,MOTRIN) 200 MG tablet, Take by mouth as needed., Disp: , Rfl:  .  levothyroxine (SYNTHROID) 112 MCG tablet, Take 1 tablet (112 mcg total) by mouth daily before breakfast., Disp: 90 tablet, Rfl: 3  Review of Systems  Constitutional: Negative for appetite change, chills, fatigue and fever.  Respiratory: Negative for chest tightness and shortness of breath.   Cardiovascular: Negative for chest pain and palpitations.  Gastrointestinal: Negative for abdominal pain, nausea and vomiting.  Neurological: Negative for dizziness and weakness.   Social History   Tobacco Use  . Smoking status: Former Smoker   Quit date: 11/16/2014    Years since quitting: 5.3  . Smokeless tobacco: Never Used  Substance Use Topics  . Alcohol use: No    Alcohol/week: 0.0 standard drinks     Objective:   BP (!) 147/115 (BP Location: Right Arm, Patient Position: Sitting, Cuff Size: Large)   Pulse 90   Temp (!) 97.3 F (36.3 C) (Other (Comment))   Resp 16   Ht 6' (1.829 m)   Wt 286 lb (129.7 kg)   SpO2 97%   BMI 38.79 kg/m  There is no height or weight on file to calculate BMI.  Physical Exam Constitutional:      General: She is not in acute distress.    Appearance: She is well-developed.  HENT:     Head: Normocephalic and atraumatic.     Right Ear: Hearing normal.     Left Ear: Hearing normal.     Nose: Nose normal.  Eyes:     General: Lids are normal. No scleral icterus.       Right eye: No discharge.        Left eye: No discharge.     Conjunctiva/sclera: Conjunctivae normal.  Pulmonary:     Effort: Pulmonary effort is normal. No respiratory distress.  Musculoskeletal:        General: Normal range of motion.  Skin:    Findings: No lesion or rash.  Neurological:     Mental Status: She is alert and oriented to person,  place, and time.  Psychiatric:        Speech: Speech normal.        Behavior: Behavior normal.        Thought Content: Thought content normal.       Assessment & Plan    1. Adult hypothyroidism Dosage of Levothyroxine was increased to 112 mg qd with elevated TSH. Will check for iron deficiency and assess thyroid function to be sure further adjustments not needed. - CBC with Differential/Platelet - Iron - T4 - TSH  2. Palpitation Short episode with elevation of BP and pulse today. After sitting in a quiet room for 15-20 minutes, BP dropped to 135/83 with pulse of 71. Suspect anxiety reaction versus thyroid dysfunction. Will recheck labs. - CBC with Differential/Platelet - Iron - T4 - TSH  3. Brittle hair Hair stylist noticed brittle coarse hair over the past  month. Will check of iron deficiency and recheck thyroid function to see if increase in Levothyroxine needs adjustment. - CBC with Differential/Platelet - Iron - T4 - TSH     Vernie Murders, PA  [facility]

## 2020-03-07 ENCOUNTER — Other Ambulatory Visit
Admission: RE | Admit: 2020-03-07 | Discharge: 2020-03-07 | Disposition: A | Discharge status: Home / Self Care | Payer: BC Managed Care – PPO | Attending: Family Medicine | Admitting: Family Medicine

## 2020-03-07 DIAGNOSIS — R002 Palpitations: Secondary | ICD-10-CM | POA: Insufficient documentation

## 2020-03-07 DIAGNOSIS — L678 Other hair color and hair shaft abnormalities: Secondary | ICD-10-CM | POA: Diagnosis not present

## 2020-03-07 DIAGNOSIS — E039 Hypothyroidism, unspecified: Secondary | ICD-10-CM | POA: Insufficient documentation

## 2020-03-07 LAB — CBC WITH DIFFERENTIAL/PLATELET
Abs Immature Granulocytes: 0.03 10*3/uL (ref 0.00–0.07)
Basophils Absolute: 0 10*3/uL (ref 0.0–0.1)
Basophils Relative: 1 %
Eosinophils Absolute: 0.1 10*3/uL (ref 0.0–0.5)
Eosinophils Relative: 2 %
HCT: 41.1 % (ref 36.0–46.0)
Hemoglobin: 13.7 g/dL (ref 12.0–15.0)
Immature Granulocytes: 1 %
Lymphocytes Relative: 37 %
Lymphs Abs: 2.3 10*3/uL (ref 0.7–4.0)
MCH: 30.5 pg (ref 26.0–34.0)
MCHC: 33.3 g/dL (ref 30.0–36.0)
MCV: 91.5 fL (ref 80.0–100.0)
Monocytes Absolute: 0.3 10*3/uL (ref 0.1–1.0)
Monocytes Relative: 5 %
Neutro Abs: 3.4 10*3/uL (ref 1.7–7.7)
Neutrophils Relative %: 54 %
Platelets: 206 10*3/uL (ref 150–400)
RBC: 4.49 MIL/uL (ref 3.87–5.11)
RDW: 12.9 % (ref 11.5–15.5)
WBC: 6.2 10*3/uL (ref 4.0–10.5)
nRBC: 0 % (ref 0.0–0.2)

## 2020-03-07 LAB — IRON: Iron: 98 ug/dL (ref 28–170)

## 2020-03-07 LAB — TSH: TSH: 3.159 u[IU]/mL (ref 0.350–4.500)

## 2020-03-08 ENCOUNTER — Telehealth: Payer: Self-pay

## 2020-03-08 LAB — T4: T4, Total: 7.1 ug/dL (ref 4.5–12.0)

## 2020-03-08 NOTE — Telephone Encounter (Signed)
-----   Message from Margo Common, [place] sent at 03/07/2020  3:33 PM EST ----- Iron level is normal and TSH back to normal range. Still awaiting T4 level to see if Levothyroxine dosage not too much.

## 2020-03-08 NOTE — Telephone Encounter (Signed)
-----   Message from Margo Common, [place] sent at 03/08/2020  8:17 AM EST ----- Normal thyroid levels. Continue the present dosage and recheck levels in 6 months to see if hair improves. May help to add some Vitamin C and Vitamin E to the Biotin you take.

## 2020-03-08 NOTE — Telephone Encounter (Signed)
Pt advised.   Thanks,   -Deepak Bless  

## 2020-03-08 NOTE — Telephone Encounter (Signed)
-----   Message from Margo Common, [place] sent at 03/07/2020  8:48 AM EST ----- CBC normal without signs of infection. Awaiting final reports on thyroid function and iron level.

## 2020-05-26 IMAGING — MG DIGITAL SCREENING BILATERAL MAMMOGRAM WITH TOMO AND CAD
8 series · 8 of 24 positions shown · non-contrast
Comparison: Previous exam(s).

CLINICAL DATA: Screening.

EXAM:
DIGITAL SCREENING BILATERAL MAMMOGRAM WITH TOMO AND CAD

[R CC synth-2D]
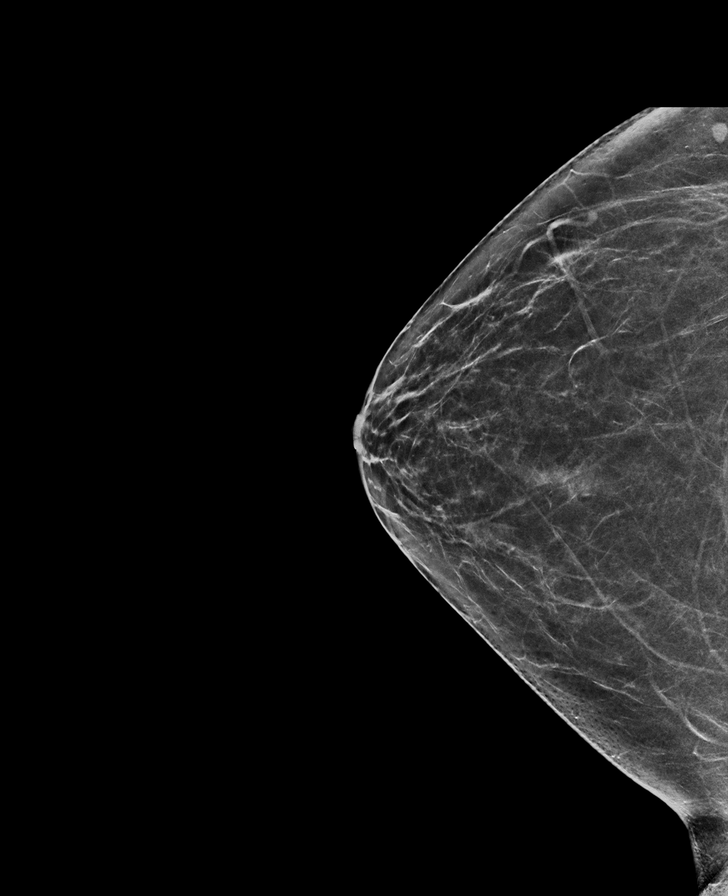

[L MLO synth-2D]
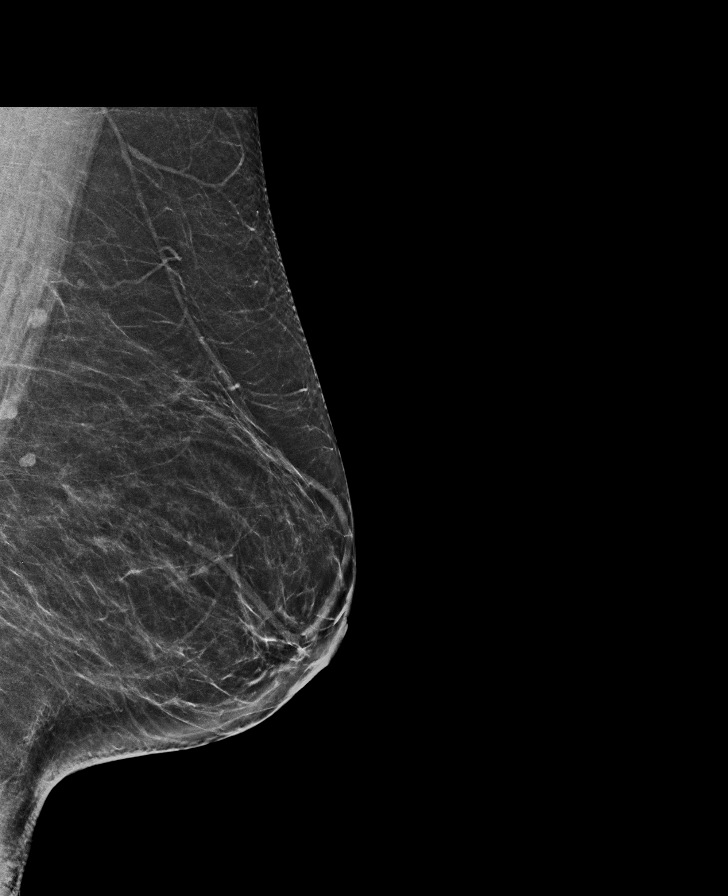

[R MLO synth-2D]
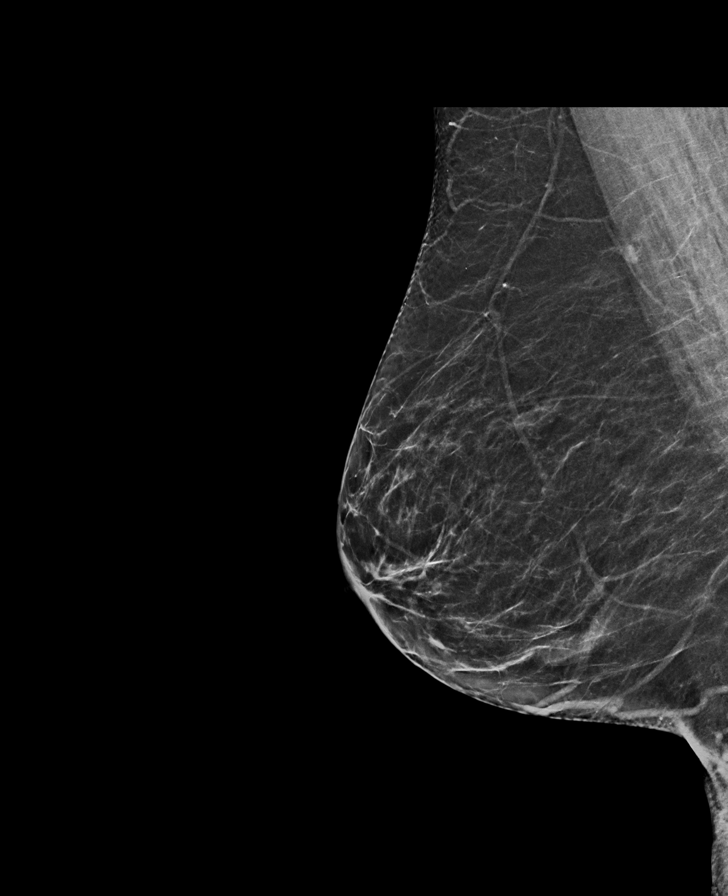

[L CC synth-2D]
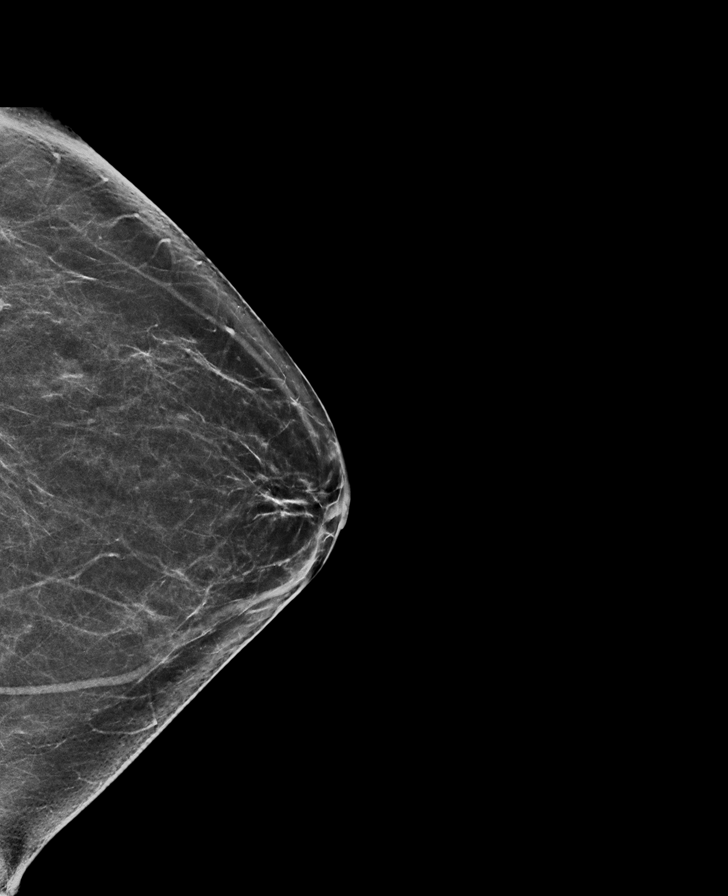

[R MLO tomo · tomo slice 34/67.0]
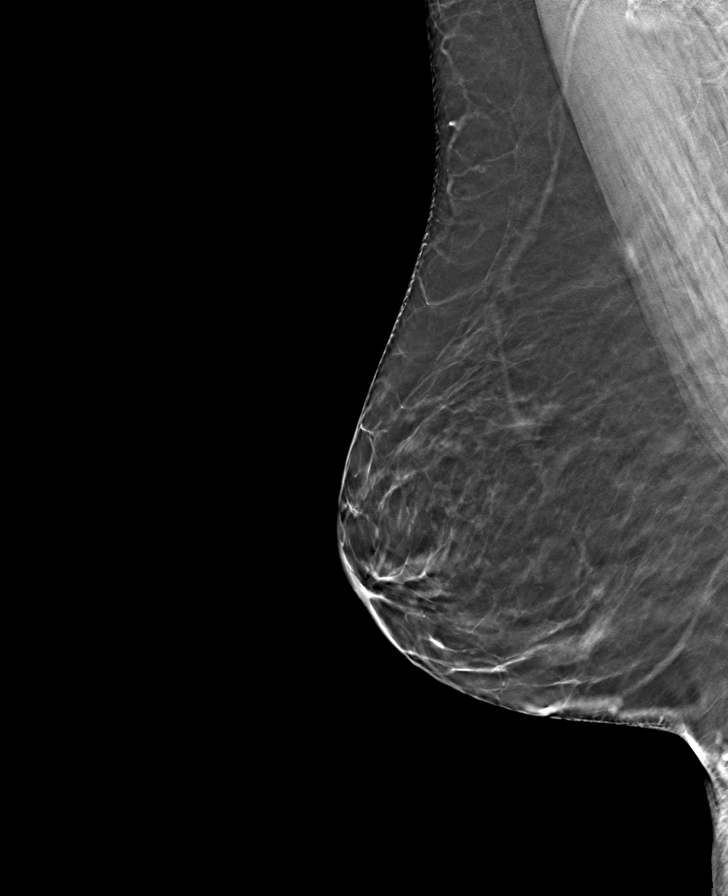

[L MLO tomo · tomo slice 35/68.0]
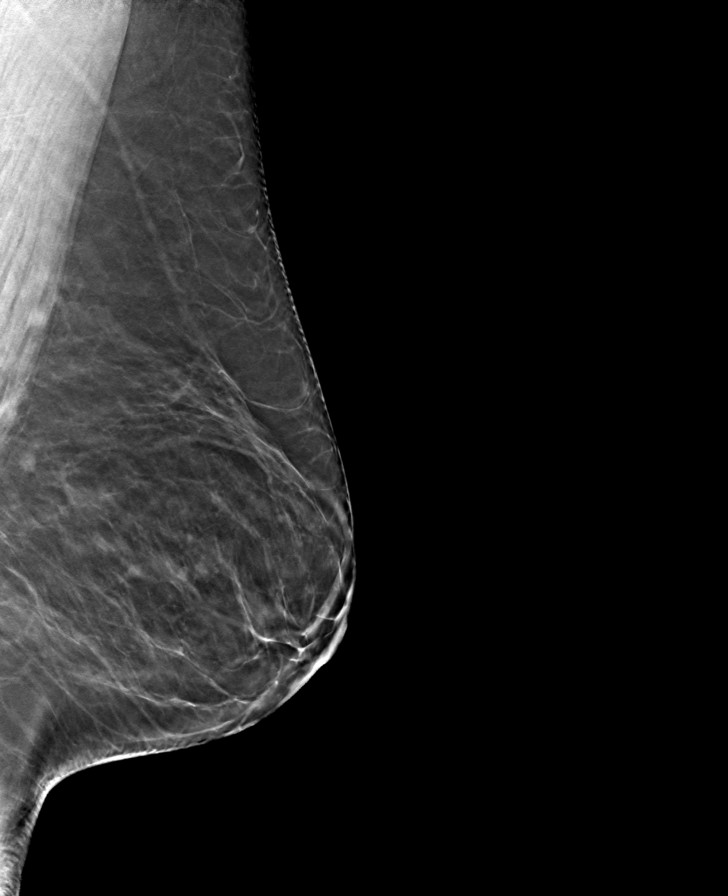

[L CC tomo · tomo slice 35/68.0]
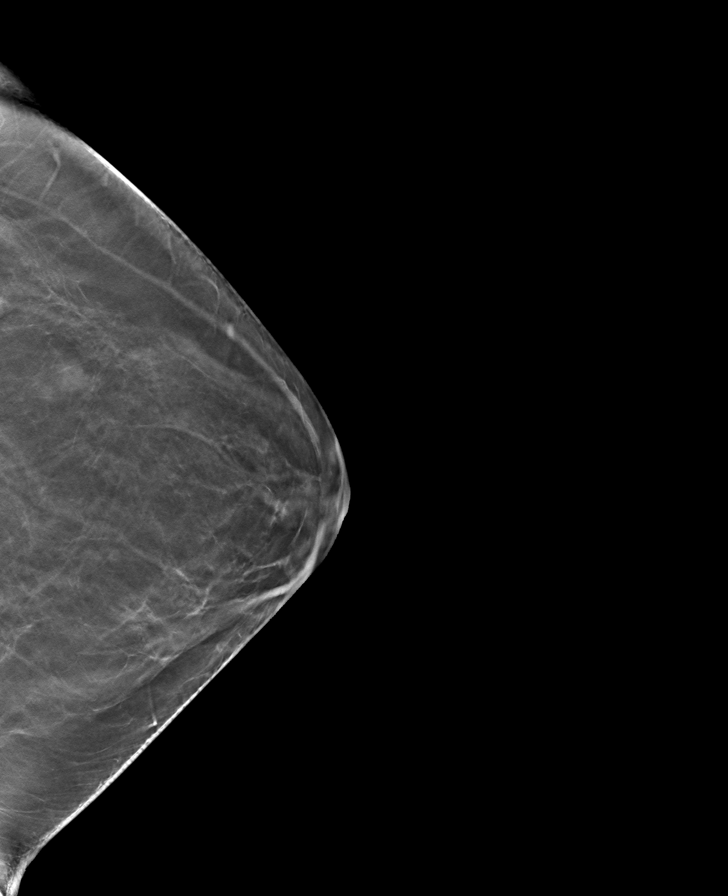

[R CC tomo · tomo slice 33/66.0]
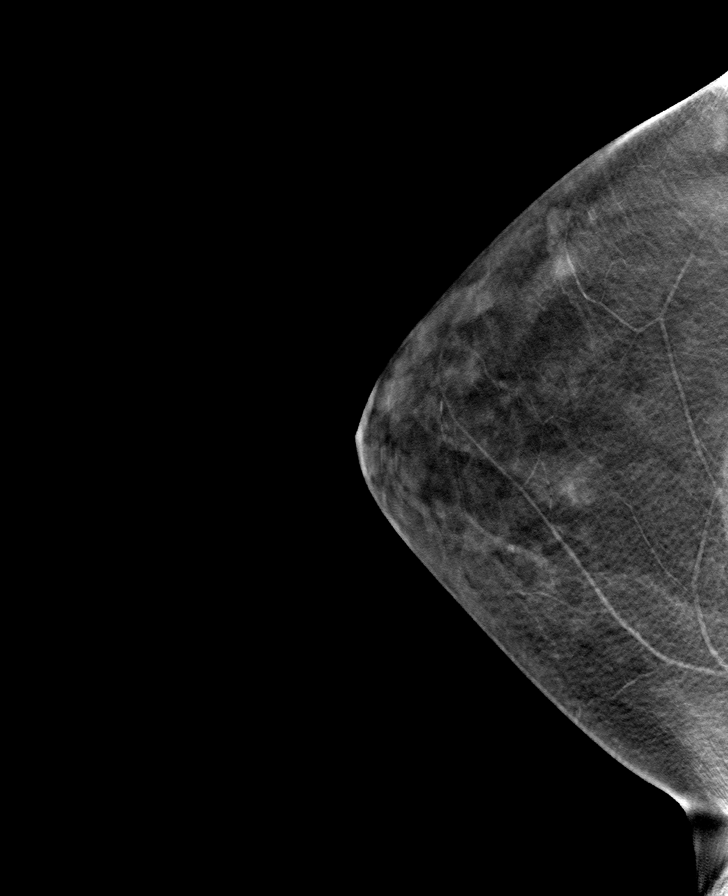

[8 of 24 positions shown; findings below may reference images not displayed]

ACR Breast Density Category b: There are scattered areas of
fibroglandular density.
FINDINGS: There are no findings suspicious for malignancy. Images were
processed with CAD.
IMPRESSION: No mammographic evidence of malignancy. A result letter of this
screening mammogram will be mailed directly to the patient.

RECOMMENDATION:
Screening mammogram in one year. (Code:[id number])

BI-RADS CATEGORY  1: Negative.

## 2020-10-11 ENCOUNTER — Ambulatory Visit: Payer: BC Managed Care – PPO | Admitting: Family Medicine

## 2020-10-11 ENCOUNTER — Other Ambulatory Visit: Payer: Self-pay

## 2020-10-11 ENCOUNTER — Encounter: Payer: Self-pay | Admitting: Family Medicine

## 2020-10-11 VITALS — HR 90 | Temp 98.3°F | Wt 276.0 lb

## 2020-10-11 DIAGNOSIS — F321 Major depressive disorder, single episode, moderate: Secondary | ICD-10-CM

## 2020-10-11 DIAGNOSIS — E039 Hypothyroidism, unspecified: Secondary | ICD-10-CM | POA: Diagnosis not present

## 2020-10-11 DIAGNOSIS — R232 Flushing: Secondary | ICD-10-CM

## 2020-10-11 MED ORDER — TRAZODONE HCL 50 MG PO TABS: 25.0000 mg | ORAL_TABLET | Freq: Every evening | ORAL | 3 refills | Status: DC | PRN

## 2020-10-11 NOTE — Progress Notes (Signed)
Acute Office Visit  Subjective:    Patient ID: Linda Rogers, female    DOB: [birth year], 44 y.o.   MRN: [id number]  Chief Complaint  Patient presents with  . Depression    HPI Patient is in today for stress and depression.  Patient states she has been more irritable, emotional, having hot flashes and unable to sleep.  She does not look forward to things that should have her excited (planned cruise soon), and she is isolating.  Past Medical History:  Diagnosis Date  . Cancer ([place])    prev cancerous cells uterus    Past Surgical History:  Procedure Laterality Date  . ABDOMINAL HYSTERECTOMY  2005  . APPENDECTOMY  1987  . TONSILLECTOMY AND ADENOIDECTOMY      Family History  Problem Relation Age of Onset  . Ovarian cancer Mother   . Colon polyps Mother   . Breast cancer Neg Hx     Social History   Socioeconomic History  . Marital status: Married    Spouse name: Not on file  . Number of children: Not on file  . Years of education: Not on file  . Highest education level: Not on file  Occupational History  . Not on file  Tobacco Use  . Smoking status: Former Smoker    Quit date: 11/16/2014    Years since quitting: 5.9  . Smokeless tobacco: Never Used  Substance and Sexual Activity  . Alcohol use: No    Alcohol/week: 0.0 standard drinks  . Drug use: No  . Sexual activity: Not on file  Other Topics Concern  . Not on file  Social History Narrative  . Not on file   Social Determinants of Health   Financial Resource Strain:   . Difficulty of Paying Living Expenses: Not on file  Food Insecurity:   . Worried About Charity fundraiser in the Last Year: Not on file  . Ran Out of Food in the Last Year: Not on file  Transportation Needs:   . Lack of Transportation (Medical): Not on file  . Lack of Transportation (Non-Medical): Not on file  Physical Activity:   . Days of Exercise per Week: Not on file  . Minutes of Exercise per Session: Not on file    Stress:   . Feeling of Stress : Not on file  Social Connections:   . Frequency of Communication with Friends and Family: Not on file  . Frequency of Social Gatherings with Friends and Family: Not on file  . Attends Religious Services: Not on file  . Active Member of Clubs or Organizations: Not on file  . Attends Archivist Meetings: Not on file  . Marital Status: Not on file  Intimate Partner Violence:   . Fear of Current or Ex-Partner: Not on file  . Emotionally Abused: Not on file  . Physically Abused: Not on file  . Sexually Abused: Not on file    Outpatient Medications Prior to Visit  Medication Sig Dispense Refill  . BIOTIN PO Take by mouth.    . ELDERBERRY PO Take by mouth.    . fluticasone (FLONASE) 50 MCG/ACT nasal spray Place into both nostrils daily.    Marland Kitchen ibuprofen (ADVIL,MOTRIN) 200 MG tablet Take by mouth as needed.    Marland Kitchen levothyroxine (SYNTHROID) 112 MCG tablet Take 1 tablet (112 mcg total) by mouth daily before breakfast. 90 tablet 3  . Cyanocobalamin (VITAMIN B 12 PO) Take by mouth.    . Fexofenadine  HCl (ALLEGRA ALLERGY PO) Take by mouth.     No facility-administered medications prior to visit.    No Known Allergies  Review of Systems  Psychiatric/Behavioral: Positive for agitation, confusion, decreased concentration, dysphoric mood and sleep disturbance. Negative for behavioral problems, hallucinations, self-injury and suicidal ideas. The patient is nervous/anxious. The patient is not hyperactive.        Objective:    Physical Exam Constitutional:      General: She is not in acute distress.    Appearance: She is well-developed.  HENT:     Head: Normocephalic and atraumatic.     Right Ear: Hearing and tympanic membrane normal.     Left Ear: Hearing and tympanic membrane normal.     Nose: Nose normal.     Mouth/Throat:     Pharynx: Oropharynx is clear.  Eyes:     General: Lids are normal. No scleral icterus.       Right eye: No discharge.         Left eye: No discharge.     Conjunctiva/sclera: Conjunctivae normal.  Neck:     Comments: No thyromegaly or nodules. Cardiovascular:     Rate and Rhythm: Normal rate and regular rhythm.     Pulses: Normal pulses.     Heart sounds: Normal heart sounds.  Pulmonary:     Effort: Pulmonary effort is normal. No respiratory distress.     Breath sounds: Normal breath sounds.  Abdominal:     General: Bowel sounds are normal.     Palpations: Abdomen is soft.  Musculoskeletal:        General: Normal range of motion.     Cervical back: Neck supple.  Skin:    Findings: No lesion or rash.  Neurological:     Mental Status: She is alert and oriented to person, place, and time.  Psychiatric:        Speech: Speech normal.        Behavior: Behavior normal.        Thought Content: Thought content normal.     Pulse 90   Temp 98.3 F (36.8 C) (Oral)   Wt 276 lb (125.2 kg)   SpO2 100%   BMI 37.43 kg/m  Wt Readings from Last 3 Encounters:  10/11/20 276 lb (125.2 kg)  03/04/20 286 lb (129.7 kg)  10/16/19 279 lb 6.4 oz (126.7 kg)    Health Maintenance Due  Topic Date Due  . Hepatitis C Screening  Never done  . COVID-19 Vaccine (1) Never done  . HIV Screening  Never done  . PAP SMEAR-Modifier  Never done  . INFLUENZA VACCINE  07/31/2020    There are no preventive care reminders to display for this patient.   Lab Results  Component Value Date   TSH 3.159 03/07/2020   Lab Results  Component Value Date   WBC 6.2 03/07/2020   HGB 13.7 03/07/2020   HCT 41.1 03/07/2020   MCV 91.5 03/07/2020   PLT 206 03/07/2020   Lab Results  Component Value Date   NA 139 10/16/2019   K 4.4 10/16/2019   CO2 23 10/16/2019   GLUCOSE 85 10/16/2019   BUN 14 10/16/2019   CREATININE 0.82 10/16/2019   BILITOT 0.5 10/16/2019   ALKPHOS 58 10/16/2019   AST 9 10/16/2019   ALT 11 10/16/2019   PROT 6.4 10/16/2019   ALBUMIN 4.1 10/16/2019   CALCIUM 9.2 10/16/2019   ANIONGAP 11 07/11/2018    Lab Results  Component Value Date  CHOL 201 (H) 07/11/2018   Lab Results  Component Value Date   HDL 43 07/11/2018   Lab Results  Component Value Date   LDLCALC 126 (H) 07/11/2018   Lab Results  Component Value Date   TRIG 160 (H) 07/11/2018   Lab Results  Component Value Date   CHOLHDL 4.7 07/11/2018   Lab Results  Component Value Date   HGBA1C 5.4 07/11/2018       Assessment & Plan:   1. Depression, major, single episode, moderate (HCC) Some spontaneous crying spells, hopelessness, sad and poor sleep pattern the past several weeks. Will check thyroid function, CBC and CMP for metabolic imbalances. Treat with Trazodone to improve sleep. Recheck pending lab reports. - traZODone (DESYREL) 50 MG tablet; Take 0.5-1 tablets (25-50 mg total) by mouth at bedtime as needed for sleep.  Dispense: 30 tablet; Refill: 3 - CBC with Differential/Platelet - TSH - Comprehensive metabolic panel  2. Adult hypothyroidism Presently on Levothyroxine 112 mcg qd. Still having brittle nails but no hair loss or exophthalmos. Depressed symptoms and diminished DTR's. Will recheck labs. - TSH - T4 - Comprehensive metabolic panel  3. Hot flashes History of hysterectomy with ovaries remaining. Will check estrogen and routine labs. - CBC with Differential/Platelet - TSH - Comprehensive metabolic panel - Estrogens, total    No orders of the defined types were placed in this encounter.  Andres Shad, PA, have reviewed all documentation for this visit. The documentation on 10/11/20 for the exam, diagnosis, procedures, and orders are all accurate and complete.   Juluis Mire, CMA

## 2020-10-13 ENCOUNTER — Telehealth: Payer: Self-pay | Admitting: Family Medicine

## 2020-10-13 LAB — CBC WITH DIFFERENTIAL/PLATELET
Basophils Absolute: 0.1 10*3/uL (ref 0.0–0.2)
Basos: 1 %
EOS (ABSOLUTE): 0.1 10*3/uL (ref 0.0–0.4)
Eos: 2 %
Hematocrit: 41.8 % (ref 34.0–46.6)
Hemoglobin: 13.9 g/dL (ref 11.1–15.9)
Immature Grans (Abs): 0 10*3/uL (ref 0.0–0.1)
Immature Granulocytes: 0 %
Lymphocytes Absolute: 3 10*3/uL (ref 0.7–3.1)
Lymphs: 44 %
MCH: 30.4 pg (ref 26.6–33.0)
MCHC: 33.3 g/dL (ref 31.5–35.7)
MCV: 92 fL (ref 79–97)
Monocytes Absolute: 0.4 10*3/uL (ref 0.1–0.9)
Monocytes: 6 %
Neutrophils Absolute: 3.3 10*3/uL (ref 1.4–7.0)
Neutrophils: 47 %
Platelets: 260 10*3/uL (ref 150–450)
RBC: 4.57 x10E6/uL (ref 3.77–5.28)
RDW: 12.6 % (ref 11.7–15.4)
WBC: 6.9 10*3/uL (ref 3.4–10.8)

## 2020-10-13 LAB — ESTROGENS, TOTAL: Estrogen: 50 pg/mL

## 2020-10-13 LAB — COMPREHENSIVE METABOLIC PANEL
ALT: 14 IU/L (ref 0–32)
AST: 16 IU/L (ref 0–40)
Albumin/Globulin Ratio: 1.6 (ref 1.2–2.2)
Albumin: 4.4 g/dL (ref 3.8–4.8)
Alkaline Phosphatase: 73 IU/L (ref 44–121)
BUN/Creatinine Ratio: 9 (ref 9–23)
BUN: 9 mg/dL (ref 6–24)
Bilirubin Total: 0.5 mg/dL (ref 0.0–1.2)
CO2: 23 mmol/L (ref 20–29)
Calcium: 9.9 mg/dL (ref 8.7–10.2)
Chloride: 104 mmol/L (ref 96–106)
Creatinine, Ser: 1 mg/dL (ref 0.57–1.00)
GFR calc Af Amer: 78 mL/min/{1.73_m2} (ref >59)
GFR calc non Af Amer: 68 mL/min/{1.73_m2} (ref >59)
Globulin, Total: 2.8 g/dL (ref 1.5–4.5)
Glucose: 94 mg/dL (ref 65–99)
Potassium: 4.4 mmol/L (ref 3.5–5.2)
Sodium: 141 mmol/L (ref 134–144)
Total Protein: 7.2 g/dL (ref 6.0–8.5)

## 2020-10-13 LAB — T4: T4, Total: 9.6 ug/dL (ref 4.5–12.0)

## 2020-10-13 LAB — TSH: TSH: 0.178 u[IU]/mL — ABNORMAL LOW (ref 0.450–4.500)

## 2020-10-13 NOTE — Telephone Encounter (Signed)
Patient is calling to receive instruction  Regarding her TSH is abnormal. Patient reviewed results on MyChart. Please advise CB- [phone]

## 2020-10-17 NOTE — Telephone Encounter (Signed)
CB to pt at [phone] Pt states missed a cb on Thursday and called back and a message was taken by NT and pt is stating that still has not heard from nurse and she is worried about her result and wants to speak with Chrismon's nurse as she has worried all weekend.  [phone]

## 2020-10-17 NOTE — Telephone Encounter (Signed)
Advised 

## 2020-12-01 ENCOUNTER — Other Ambulatory Visit: Payer: Self-pay

## 2020-12-01 ENCOUNTER — Encounter: Payer: Self-pay | Admitting: Family Medicine

## 2020-12-01 ENCOUNTER — Ambulatory Visit (INDEPENDENT_AMBULATORY_CARE_PROVIDER_SITE_OTHER): Payer: BC Managed Care – PPO | Admitting: Family Medicine

## 2020-12-01 VITALS — BP 118/74 | HR 72 | Temp 98.8°F | Resp 16 | Ht 72.0 in | Wt 286.0 lb

## 2020-12-01 DIAGNOSIS — E039 Hypothyroidism, unspecified: Secondary | ICD-10-CM

## 2020-12-01 DIAGNOSIS — N951 Menopausal and female climacteric states: Secondary | ICD-10-CM

## 2020-12-01 DIAGNOSIS — R5383 Other fatigue: Secondary | ICD-10-CM

## 2020-12-01 DIAGNOSIS — F321 Major depressive disorder, single episode, moderate: Secondary | ICD-10-CM | POA: Diagnosis not present

## 2020-12-01 NOTE — Progress Notes (Signed)
Established patient visit   Patient: Linda Rogers   DOB: Jul 22, 1976   44 y.o. Female  MRN: [id number] Visit Date: 12/01/2020  Today's healthcare provider: Vernie Murders, PA   Chief Complaint  Patient presents with  . Hypothyroidism   Subjective    HPI   Past Medical History:  Diagnosis Date  . Cancer ([place])    prev cancerous cells uterus   Past Surgical History:  Procedure Laterality Date  . ABDOMINAL HYSTERECTOMY  2005  . APPENDECTOMY  1987  . TONSILLECTOMY AND ADENOIDECTOMY     Social History   Tobacco Use  . Smoking status: Former Smoker    Quit date: 11/16/2014    Years since quitting: 6.0  . Smokeless tobacco: Never Used  Substance Use Topics  . Alcohol use: No    Alcohol/week: 0.0 standard drinks  . Drug use: No   Family History  Problem Relation Age of Onset  . Ovarian cancer Mother   . Colon polyps Mother   . Breast cancer Neg Hx    No Known Allergies     Medications: Outpatient Medications Prior to Visit  Medication Sig  . BIOTIN PO Take by mouth.  . ELDERBERRY PO Take by mouth.  . fluticasone (FLONASE) 50 MCG/ACT nasal spray Place into both nostrils daily.  Marland Kitchen ibuprofen (ADVIL,MOTRIN) 200 MG tablet Take by mouth as needed.  Marland Kitchen levothyroxine (SYNTHROID) 112 MCG tablet Take 1 tablet (112 mcg total) by mouth daily before breakfast.  . traZODone (DESYREL) 50 MG tablet Take 0.5-1 tablets (25-50 mg total) by mouth at bedtime as needed for sleep.   No facility-administered medications prior to visit.    Review of Systems  Constitutional: Positive for appetite change and unexpected weight change.  HENT: Negative.   Respiratory: Negative.   Cardiovascular: Negative.   Gastrointestinal: Negative.       Objective    BP 118/74   Pulse 72   Temp 98.8 F (37.1 C) (Oral)   Resp 16   Ht 6' (1.829 m)   Wt 286 lb (129.7 kg)   SpO2 100%   BMI 38.79 kg/m  BP Readings from Last 3 Encounters:  12/01/20 118/74  03/04/20 135/83    10/16/19 120/76   Wt Readings from Last 3 Encounters:  12/01/20 286 lb (129.7 kg)  10/11/20 276 lb (125.2 kg)  03/04/20 286 lb (129.7 kg)     Physical Exam Constitutional:      General: She is not in acute distress.    Appearance: She is well-developed.  HENT:     Head: Normocephalic and atraumatic.     Right Ear: Hearing normal.     Left Ear: Hearing normal.     Nose: Nose normal.  Eyes:     General: Lids are normal. No scleral icterus.       Right eye: No discharge.        Left eye: No discharge.     Conjunctiva/sclera: Conjunctivae normal.  Cardiovascular:     Rate and Rhythm: Regular rhythm.     Pulses: Normal pulses.     Heart sounds: Normal heart sounds.  Pulmonary:     Effort: Pulmonary effort is normal. No respiratory distress.     Breath sounds: Normal breath sounds.  Abdominal:     Palpations: Abdomen is soft.  Musculoskeletal:        General: Normal range of motion.     Cervical back: Neck supple.  Skin:    Findings: No lesion  or rash.  Neurological:     Mental Status: She is alert and oriented to person, place, and time.     Deep Tendon Reflexes: Reflexes normal.  Psychiatric:        Speech: Speech normal.        Behavior: Behavior normal.        Thought Content: Thought content normal.       No results found for any visits on 12/01/20.  Assessment & Plan     1. Adult hypothyroidism Still taking the Levothyroxine 112 mcg qd. No tremor, exophthalmos, but, regained 10 lbs. Recheck thyroid function tests. - T4 - TSH - CBC with Differential/Platelet  2. Depression, major, single episode, moderate (HCC) Sleeping better with taking the Trazodone 25 mg qd. Continue this dose and recheck pending lab reports. - CBC with Differential/Platelet - Comprehensive metabolic panel  3. Fatigue, unspecified type Not much energy. Feeling more hungry than usual. No polyuria. Family history positive for DM. Recheck labs. - T4 - TSH - CBC with  Differential/Platelet - Comprehensive metabolic panel  4. Menopausal syndrome (hot flashes) Symptoms well controlled with Amberen (OTC) supplement. Recheck labs. - TSH - CBC with Differential/Platelet - Comprehensive metabolic panel   No follow-ups on file.      Andres Shad, PA, have reviewed all documentation for this visit. The documentation on 12/01/20 for the exam, diagnosis, procedures, and orders are all accurate and complete.    Vernie Murders, [place] [phone] (phone) [phone] (fax)  [place]

## 2020-12-02 LAB — CBC WITH DIFFERENTIAL/PLATELET
Basophils Absolute: 0.1 10*3/uL (ref 0.0–0.2)
Basos: 1 %
EOS (ABSOLUTE): 0.1 10*3/uL (ref 0.0–0.4)
Eos: 1 %
Hematocrit: 39.7 % (ref 34.0–46.6)
Hemoglobin: 13.4 g/dL (ref 11.1–15.9)
Immature Grans (Abs): 0 10*3/uL (ref 0.0–0.1)
Immature Granulocytes: 0 %
Lymphocytes Absolute: 2.6 10*3/uL (ref 0.7–3.1)
Lymphs: 41 %
MCH: 30.5 pg (ref 26.6–33.0)
MCHC: 33.8 g/dL (ref 31.5–35.7)
MCV: 90 fL (ref 79–97)
Monocytes Absolute: 0.4 10*3/uL (ref 0.1–0.9)
Monocytes: 7 %
Neutrophils Absolute: 3.2 10*3/uL (ref 1.4–7.0)
Neutrophils: 50 %
Platelets: 190 10*3/uL (ref 150–450)
RBC: 4.39 x10E6/uL (ref 3.77–5.28)
RDW: 12.9 % (ref 11.7–15.4)
WBC: 6.3 10*3/uL (ref 3.4–10.8)

## 2020-12-02 LAB — COMPREHENSIVE METABOLIC PANEL
ALT: 12 IU/L (ref 0–32)
AST: 11 IU/L (ref 0–40)
Albumin/Globulin Ratio: 1.9 (ref 1.2–2.2)
Albumin: 4.2 g/dL (ref 3.8–4.8)
Alkaline Phosphatase: 74 IU/L (ref 44–121)
BUN/Creatinine Ratio: 13 (ref 9–23)
BUN: 14 mg/dL (ref 6–24)
Bilirubin Total: 0.4 mg/dL (ref 0.0–1.2)
CO2: 20 mmol/L (ref 20–29)
Calcium: 9.5 mg/dL (ref 8.7–10.2)
Chloride: 103 mmol/L (ref 96–106)
Creatinine, Ser: 1.07 mg/dL — ABNORMAL HIGH (ref 0.57–1.00)
GFR calc Af Amer: 72 mL/min/{1.73_m2} (ref >59)
GFR calc non Af Amer: 62 mL/min/{1.73_m2} (ref >59)
Globulin, Total: 2.2 g/dL (ref 1.5–4.5)
Glucose: 94 mg/dL (ref 65–99)
Potassium: 4.1 mmol/L (ref 3.5–5.2)
Sodium: 141 mmol/L (ref 134–144)
Total Protein: 6.4 g/dL (ref 6.0–8.5)

## 2020-12-02 LAB — TSH: TSH: 0.353 u[IU]/mL — ABNORMAL LOW (ref 0.450–4.500)

## 2020-12-02 LAB — T4: T4, Total: 9.7 ug/dL (ref 4.5–12.0)

## 2020-12-08 ENCOUNTER — Telehealth: Payer: Self-pay

## 2020-12-08 NOTE — Telephone Encounter (Signed)
Copied from [place] [id number]. Topic: Quick Communication - See Telephone Encounter >> Dec 07, 2020  4:20 PM Loma [place] wrote: CRM for notification. See Telephone encounter for: 12/07/20.pls advise pt of Lab results, labs say pt advised and pt says she has not spoken to anyone or gotten a VM. Not released to [facility]

## 2020-12-08 NOTE — Telephone Encounter (Signed)
I called patient and advised her of lab results.

## 2020-12-21 ENCOUNTER — Other Ambulatory Visit: Payer: Self-pay | Admitting: Family Medicine

## 2020-12-21 DIAGNOSIS — E039 Hypothyroidism, unspecified: Secondary | ICD-10-CM

## 2021-03-22 ENCOUNTER — Other Ambulatory Visit: Payer: Self-pay | Admitting: Family Medicine

## 2021-03-22 DIAGNOSIS — E039 Hypothyroidism, unspecified: Secondary | ICD-10-CM

## 2021-04-03 ENCOUNTER — Ambulatory Visit: Payer: BC Managed Care – PPO | Admitting: Family Medicine

## 2021-04-05 ENCOUNTER — Ambulatory Visit: Payer: Self-pay | Admitting: Family Medicine

## 2021-04-11 ENCOUNTER — Ambulatory Visit: Payer: BC Managed Care – PPO | Admitting: Family Medicine

## 2021-04-11 ENCOUNTER — Other Ambulatory Visit: Payer: Self-pay

## 2021-04-11 ENCOUNTER — Encounter: Payer: Self-pay | Admitting: Family Medicine

## 2021-04-11 VITALS — BP 135/81 | HR 76 | Temp 98.7°F | Wt 290.0 lb

## 2021-04-11 DIAGNOSIS — N951 Menopausal and female climacteric states: Secondary | ICD-10-CM | POA: Diagnosis not present

## 2021-04-11 DIAGNOSIS — M25532 Pain in left wrist: Secondary | ICD-10-CM

## 2021-04-11 DIAGNOSIS — B351 Tinea unguium: Secondary | ICD-10-CM

## 2021-04-11 DIAGNOSIS — E039 Hypothyroidism, unspecified: Secondary | ICD-10-CM | POA: Diagnosis not present

## 2021-04-11 DIAGNOSIS — F321 Major depressive disorder, single episode, moderate: Secondary | ICD-10-CM

## 2021-04-11 MED ORDER — LEVOTHYROXINE SODIUM 112 MCG PO TABS: 112.0000 ug | ORAL_TABLET | Freq: Every day | ORAL | 3 refills | Status: AC

## 2021-04-11 MED ORDER — TRAZODONE HCL 50 MG PO TABS: 25.0000 mg | ORAL_TABLET | Freq: Every evening | ORAL | 3 refills | Status: AC | PRN

## 2021-04-11 NOTE — Patient Instructions (Signed)
Carpal Tunnel Syndrome  Carpal tunnel syndrome is a condition that causes pain, weakness, and numbness in your hand and arm. Numbness is when you cannot feel an area in your body. The carpal tunnel is a narrow area that is on the palm side of your wrist. Repeated wrist motion or certain diseases may cause swelling in the tunnel. This swelling can pinch the main nerve in the wrist. This nerve is called the median nerve. What are the causes? This condition may be caused by:  Moving your hand and wrist over and over again while doing a task.  Injury to the wrist.  Arthritis.  A sac of fluid (cyst) or abnormal growth (tumor) in the carpal tunnel.  Fluid buildup during pregnancy.  Use of tools that vibrate. Sometimes the cause is not known. What increases the risk? The following factors may make you more likely to have this condition:  Having a job that makes you do these things: ? Move your hand over and over again. ? Work with tools that vibrate, such as drills or sanders.  Being a woman.  Having diabetes, obesity, thyroid problems, or kidney failure. What are the signs or symptoms? Symptoms of this condition include:  A tingling feeling in your fingers.  Tingling or loss of feeling in your hand.  Pain in your entire arm. This pain may get worse when you bend your wrist and elbow for a long time.  Pain in your wrist that goes up your arm to your shoulder.  Pain that goes down into your palm or fingers.  Weakness in your hands. You may find it hard to grab and hold items. You may feel worse at night. How is this treated? This condition may be treated with:  Lifestyle changes. You will be asked to stop or change the activity that caused your problem.  Doing exercises and activities that make bones, muscles, and tendons stronger (physical therapy).  Learning how to use your hand again (occupational therapy).  Medicines for pain and swelling. You may have injections in  your wrist.  A wrist splint or brace.  Surgery. Follow these instructions at home: If you have a splint or brace:  Wear the splint or brace as told by your doctor. Take it off only as told by your doctor.  Loosen the splint if your fingers: ? Tingle. ? Become numb. ? Turn cold and blue.  Keep the splint or brace clean.  If the splint or brace is not waterproof: ? Do not let it get wet. ? Cover it with a watertight covering when you take a bath or a shower. Managing pain, stiffness, and swelling If told, put ice on the painful area:  If you have a removable splint or brace, remove it as told by your doctor.  Put ice in a plastic bag.  Place a towel between your skin and the bag.  Leave the ice on for 20 minutes, 2-3 times per day. Do not fall asleep with the cold pack on your skin.  Take off the ice if your skin turns bright red. This is very important. If you cannot feel pain, heat, or cold, you have a greater risk of damage to the area. Move your fingers often to reduce stiffness and swelling.   General instructions  Take over-the-counter and prescription medicines only as told by your doctor.  Rest your wrist from any activity that may cause pain. If needed, talk with your boss at work about changes that can   help your wrist heal.  Do exercises as told by your doctor, physical therapist, or occupational therapist.  Keep all follow-up visits. Contact a doctor if:  You have new symptoms.  Medicine does not help your pain.  Your symptoms get worse. Get help right away if:  You have very bad numbness or tingling in your wrist or hand. Summary  Carpal tunnel syndrome is a condition that causes pain in your hand and arm.  It is often caused by repeated wrist motions.  Lifestyle changes and medicines are used to treat this problem. Surgery may help in very bad cases.  Follow your doctor's instructions about wearing a splint, resting your wrist, keeping follow-up  visits, and calling for help. This information is not intended to replace advice given to you by your health care provider. Make sure you discuss any questions you have with your health care provider. Document Revised: 04/28/2020 Document Reviewed: 04/28/2020 Elsevier Patient Education  2021 Elsevier Inc.  

## 2021-04-11 NOTE — Progress Notes (Signed)
Established patient visit   Patient: Linda Rogers   DOB: [birth year]   45 y.o. Female  MRN: [id number] Visit Date: 04/11/2021  Today's healthcare provider: Laurita Quint Earlyne Feeser, FNP   Chief Complaint  Patient presents with  . Wrist Pain  . Nail Problem    Toe nail fungus right 5th toe   Subjective    Wrist Pain  The pain is present in the left wrist. This is a new problem. The current episode started more than 1 month ago. There has been no history of extremity trauma. The problem has been gradually worsening. Quality: stabbing. Pertinent negatives include no fever, joint locking, joint swelling, limited range of motion, numbness, stiffness or tingling. She has tried NSAIDS and OTC pain meds for the symptoms. The treatment provided no relief.  She is on the computer frequently at work   Menopause: continues to have hot flashes Causes insomnia Has been going on for a year Hysterectomy long time ago, but left the ovaries Uses OTC and prescription medications with little benefit  HPI    Nail Problem     Additional comments: Toe nail fungus right 5th toe       Last edited by Kizzie Furnish, CMA on 04/11/2021  1:17 PM. (History)      Hypothyroid, follow-up  Lab Results  Component Value Date   TSH 0.353 (L) 12/01/2020   TSH 0.178 (L) 10/11/2020   TSH 3.159 03/07/2020   T4TOTAL 9.7 12/01/2020   T4TOTAL 9.6 10/11/2020   Wt Readings from Last 3 Encounters:  04/11/21 290 lb (131.5 kg)  12/01/20 286 lb (129.7 kg)  10/11/20 276 lb (125.2 kg)    She was last seen for hypothyroid 4 months ago.  Management since that visit includes check labs. She reports excellent compliance with treatment. She is not having side effects.   Symptoms: Yes change in energy level No constipation  No diarrhea Yes heat / cold intolerance  No nervousness No palpitations  No weight changes     Pinky toe has always been thicker Dog stepped on toe and put a hole thru it Uses the OTC  nail fungus gel Has went thru a bottle and a half Not getting worse, about the same  -----------------------------------------------------------------------------------------   Patient Active Problem List   Diagnosis Date Noted  . Depression, major, single episode, moderate ([place]) 05/16/2017  . 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 Cerebral artery occlusion with cerebral infarction ([place]) 06/15/2015  . Adult hypothyroidism 06/15/2015  . Migraine 06/15/2015   Social History   Tobacco Use  . Smoking status: Former Smoker    Quit date: 11/16/2014    Years since quitting: 6.4  . Smokeless tobacco: Never Used  Substance Use Topics  . Alcohol use: No    Alcohol/week: 0.0 standard drinks  . Drug use: No   No Known Allergies     Medications: Outpatient Medications Prior to Visit  Medication Sig  . BIOTIN PO Take by mouth.  . ELDERBERRY PO Take by mouth.  . fluticasone (FLONASE) 50 MCG/ACT nasal spray Place into both nostrils daily.  Marland Kitchen ibuprofen (ADVIL,MOTRIN) 200 MG tablet Take by mouth as needed.  . [DISCONTINUED] levothyroxine (SYNTHROID) 112 MCG tablet TAKE ONE TABLET BY MOUTH ON EMPTY STOMACH WITH A GLASS OF WATER AT LEAST 30-60 MIN BEFORE BREAKFAST  . [DISCONTINUED] traZODone (DESYREL) 50 MG tablet Take 0.5-1 tablets (25-50 mg total) by mouth at bedtime as needed for sleep.   No  facility-administered medications prior to visit.    Review of Systems  Constitutional: Positive for fatigue. Negative for activity change, appetite change, chills, diaphoresis, fever and unexpected weight change.  Respiratory: Negative.   Cardiovascular: Negative.   Gastrointestinal: Negative.   Endocrine: Positive for heat intolerance. Negative for cold intolerance, polydipsia, polyphagia and polyuria.  Musculoskeletal: Negative for stiffness.  Neurological: Negative for dizziness, tingling, light-headedness, numbness and headaches.       Objective    BP 135/81 (BP  Location: Left Arm, Patient Position: Sitting, Cuff Size: Large)   Pulse 76   Temp 98.7 F (37.1 C) (Oral)   Wt 290 lb (131.5 kg)   BMI 39.33 kg/m  BP Readings from Last 3 Encounters:  04/11/21 135/81  12/01/20 118/74  03/04/20 135/83   Wt Readings from Last 3 Encounters:  04/11/21 290 lb (131.5 kg)  12/01/20 286 lb (129.7 kg)  10/11/20 276 lb (125.2 kg)      Physical Exam Constitutional:      General: She is not in acute distress.    Appearance: Normal appearance. She is not ill-appearing.  HENT:     Head: Normocephalic.  Cardiovascular:     Rate and Rhythm: Normal rate and regular rhythm.     Pulses: Normal pulses.     Heart sounds: Normal heart sounds. No murmur heard. No friction rub. No gallop.   Pulmonary:     Effort: Pulmonary effort is normal. No respiratory distress.     Breath sounds: Normal breath sounds. No stridor. No wheezing, rhonchi or rales.  Abdominal:     General: Bowel sounds are normal.     Palpations: Abdomen is soft.     Tenderness: There is no abdominal tenderness.  Musculoskeletal:     Right wrist: Normal.     Left wrist: Tenderness (right lateral wrist, pain with thumb movement) and bony tenderness present. No swelling, deformity or effusion. Normal pulse.     Right lower leg: No edema.     Left lower leg: No edema.  Skin:    General: Skin is warm and dry.     Comments: Right smallest toe nail thickened and slight yellow discoloration, no pain, drainage, or erythema  Neurological:     Mental Status: She is alert and oriented to person, place, and time.  Psychiatric:        Mood and Affect: Mood normal.        Behavior: Behavior normal.      No results found for any visits on 04/11/21.  Assessment & Plan     Problem List Items Addressed This Visit      Endocrine   Adult hypothyroidism - Primary   Relevant Medications   levothyroxine (SYNTHROID) 112 MCG tablet   Other Relevant Orders   TSH     Other   Depression, major, single  episode, moderate (HCC)   Relevant Medications   traZODone (DESYREL) 50 MG tablet    Other Visit Diagnoses    Left wrist pain       Relevant Orders   AMB referral to orthopedics   Menopausal syndrome (hot flashes)       Nail fungus         Plan  Will follow up with lab results and levothyroxine  Referral placed to ortho for wrist pain  Continue OTC treatment for nail fungus, RTC precautions provided  Trazodone refills sent  Return in about 6 months (around 10/11/2021).      Laurita Quint Angala Hilgers, FNP  [place] [phone] (phone) [phone] (fax)  [place]

## 2021-04-12 LAB — TSH: TSH: 0.701 u[IU]/mL (ref 0.450–4.500)

## 2021-10-13 ENCOUNTER — Ambulatory Visit: Payer: BC Managed Care – PPO | Admitting: Family Medicine

## 2022-02-02 ENCOUNTER — Telehealth: Payer: Self-pay | Admitting: Family Medicine

## 2022-02-02 NOTE — Telephone Encounter (Signed)
Leonore faxed refill request for the following medications:  traZODone (DESYREL) 50 MG tablet   Please advise.

## 2022-02-05 NOTE — Telephone Encounter (Signed)
Confirmed with patient that she will be establishing with Dr Doy Hutching and we can disregard this request.

## 2022-02-09 ENCOUNTER — Other Ambulatory Visit: Payer: Self-pay | Admitting: Internal Medicine

## 2022-02-09 DIAGNOSIS — Z1231 Encounter for screening mammogram for malignant neoplasm of breast: Secondary | ICD-10-CM

## 2022-04-05 ENCOUNTER — Ambulatory Visit
Admission: RE | Admit: 2022-04-05 | Discharge: 2022-04-05 | Disposition: A | Discharge status: Home / Self Care | Payer: BC Managed Care – PPO | Source: Ambulatory Visit | Attending: Internal Medicine | Admitting: Internal Medicine

## 2022-04-05 DIAGNOSIS — Z1231 Encounter for screening mammogram for malignant neoplasm of breast: Secondary | ICD-10-CM | POA: Insufficient documentation

## 2023-02-27 ENCOUNTER — Other Ambulatory Visit: Payer: Self-pay | Admitting: Internal Medicine

## 2023-02-27 DIAGNOSIS — Z1231 Encounter for screening mammogram for malignant neoplasm of breast: Secondary | ICD-10-CM

## 2023-03-02 ENCOUNTER — Ambulatory Visit
Admission: EM | Admit: 2023-03-02 | Discharge: 2023-03-02 | Disposition: A | Discharge status: Home / Self Care | Payer: BC Managed Care – PPO | Attending: Family Medicine | Admitting: Family Medicine

## 2023-03-02 ENCOUNTER — Encounter: Payer: Self-pay | Admitting: Emergency Medicine

## 2023-03-02 DIAGNOSIS — J320 Chronic maxillary sinusitis: Secondary | ICD-10-CM | POA: Diagnosis present

## 2023-03-02 LAB — GROUP A STREP BY PCR: Group A Strep by PCR: NOT DETECTED

## 2023-03-02 MED ORDER — AMOXICILLIN-POT CLAVULANATE 875-125 MG PO TABS: 1.0000 | ORAL_TABLET | Freq: Two times a day (BID) | ORAL | 0 refills | Status: AC

## 2023-03-02 NOTE — ED Provider Notes (Signed)
[facility]    CSN: [id number] Arrival date & time: 03/02/23  1027      History   Chief Complaint Chief Complaint  Patient presents with   Cough   Sore Throat    HPI 47 year old female presents with respiratory symptoms.  1 week of symptoms.  Reports sore throat, left-sided facial pain and swelling.  No fever.  No chills.  Home COVID testing negative.  Also reports congestion.  Mild cough.  No relieving factors.  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      Home Medications    Prior to Admission medications   Medication Sig Start Date End Date Taking? Authorizing Provider  amoxicillin-clavulanate (AUGMENTIN) 875-125 MG tablet Take 1 tablet by mouth every 12 (twelve) hours. 03/02/23  Yes Thersa Salt G, DO  cloNIDine (CATAPRES) 0.1 MG tablet Take by mouth. 02/26/23 02/26/24 Yes [provider]  hydrochlorothiazide (HYDRODIURIL) 25 MG tablet Take 1 tablet by mouth daily. 11/12/22 11/12/23 Yes [provider]  venlafaxine XR (EFFEXOR-XR) 150 MG 24 hr capsule Take 1 tablet by mouth daily. 02/11/23  Yes [provider]  BIOTIN PO Take by mouth.    [provider]  ELDERBERRY PO Take by mouth.    [provider]  fluticasone (FLONASE) 50 MCG/ACT nasal spray Place into both nostrils daily.    [provider]  ibuprofen (ADVIL,MOTRIN) 200 MG tablet Take by mouth as needed.    [provider]  levothyroxine (SYNTHROID) 112 MCG tablet Take 1 tablet (112 mcg total) by mouth daily before breakfast. 04/11/21 04/06/22  Just, Laurita Quint, FNP  traZODone (DESYREL) 50 MG tablet Take 0.5-1 tablets (25-50 mg total) by mouth at bedtime as needed for sleep. 04/11/21   Just, Laurita Quint, FNP    Family History Family History  Problem Relation Age of Onset   Ovarian cancer Mother    Colon polyps Mother    Breast cancer Neg Hx     Social History Social History   Tobacco Use   Smoking status: Former    Types: Cigarettes    Quit date: 11/16/2014    Years since quitting: 8.2   Smokeless tobacco: Never  Vaping Use   Vaping Use: Every day  Substance Use Topics   Alcohol use: No    Alcohol/week: 0.0 standard drinks of alcohol   Drug use: No     Allergies   Patient has no known allergies.   Review of Systems Review of Systems Per HPI  Physical Exam Triage Vital Signs ED Triage Vitals  Enc Vitals Group     BP 03/02/23 1041 (!) 136/95     Pulse Rate 03/02/23 1041 (!) 110     Resp 03/02/23 1041 14     Temp 03/02/23 1041 98.9 F (37.2 C)     Temp Source 03/02/23 1041 Oral     SpO2 03/02/23 1041 98 %  Weight 03/02/23 1040 265 lb (120.2 kg)     Height 03/02/23 1040 6' (1.829 m)     Head Circumference --      Peak Flow --      Pain Score 03/02/23 1039 3     Pain Loc --      Pain Edu? --      Excl. in [place]? --    No data found.  Updated Vital Signs BP (!) 136/95 (BP Location: Left Arm)   Pulse (!) 110   Temp 98.9 F (37.2 C) (Oral)   Resp 14   Ht 6' (1.829 m)   Wt 120.2 kg   SpO2 98%   BMI 35.94 kg/m   Visual Acuity Right Eye Distance:   Left Eye Distance:   Bilateral Distance:    Right Eye Near:   Left Eye Near:    Bilateral Near:     Physical Exam Vitals and nursing note reviewed.  Constitutional:      General: She is not in acute distress.    Appearance: She is obese.  HENT:     Head: Normocephalic and atraumatic.     Nose:     Comments:  Left maxillary sinus tenderness to palpation.  Mild swelling.    Mouth/Throat:     Pharynx: Posterior oropharyngeal erythema present. No oropharyngeal exudate.  Cardiovascular:     Rate and Rhythm: Regular rhythm. Tachycardia present.  Pulmonary:     Effort: Pulmonary effort is normal.     Breath sounds: Normal breath sounds. No wheezing, rhonchi or rales.  Neurological:     Mental Status: She is alert.      UC Treatments / Results  Labs (all labs ordered are listed, but only abnormal results are displayed) Labs Reviewed  GROUP A STREP BY PCR    EKG   Radiology No results found.  Procedures Procedures (including critical care time)  Medications Ordered in UC Medications - No data to display  Initial Impression / Assessment and Plan / UC Course  I have reviewed the triage vital signs and the nursing notes.  Pertinent labs & imaging results that were available during my care of the patient were reviewed by me and considered in my medical decision making (see chart for details).    47 year old female presents for evaluation of the above.  Clinically, patient appears to be suffering from sinusitis.  Treating with Augmentin.  Final Clinical Impressions(s) / UC Diagnoses   Final diagnoses:  Left maxillary sinusitis   Discharge Instructions   None    ED Prescriptions     Medication Sig Dispense Auth. Provider   amoxicillin-clavulanate (AUGMENTIN) 875-125 MG tablet Take 1 tablet by mouth every 12 (twelve) hours. 14 tablet Coral Spikes, DO      PDMP not reviewed this encounter.   Coral Spikes, [place] 03/02/23 1153

## 2023-03-02 NOTE — ED Triage Notes (Signed)
Patient c/o c/o cold symptoms that started on Monday.  Patient c/o pain on left side of her face and in her throat for the past couple of days.  Patient has taken two covid tests at home and both negative.

## 2023-05-31 ENCOUNTER — Ambulatory Visit
Admission: RE | Admit: 2023-05-31 | Discharge: 2023-05-31 | Disposition: A | Discharge status: Home / Self Care | Payer: BC Managed Care – PPO | Source: Ambulatory Visit | Attending: Internal Medicine | Admitting: Internal Medicine

## 2023-05-31 DIAGNOSIS — Z1231 Encounter for screening mammogram for malignant neoplasm of breast: Secondary | ICD-10-CM | POA: Insufficient documentation

## 2024-04-27 ENCOUNTER — Other Ambulatory Visit: Payer: Self-pay | Admitting: Internal Medicine

## 2024-04-27 DIAGNOSIS — Z1231 Encounter for screening mammogram for malignant neoplasm of breast: Secondary | ICD-10-CM

## 2024-06-10 ENCOUNTER — Ambulatory Visit
Admission: RE | Admit: 2024-06-10 | Discharge: 2024-06-10 | Disposition: A | Discharge status: Home / Self Care | Payer: Self-pay | Source: Ambulatory Visit | Attending: Internal Medicine | Admitting: Internal Medicine

## 2024-06-10 DIAGNOSIS — Z1231 Encounter for screening mammogram for malignant neoplasm of breast: Secondary | ICD-10-CM | POA: Insufficient documentation

## 2024-08-05 ENCOUNTER — Encounter
Admission: RE | Disposition: A | Discharge status: Home / Self Care | Payer: Self-pay | Source: Home / Self Care | Attending: Gastroenterology

## 2024-08-05 ENCOUNTER — Ambulatory Visit: Admitting: General Practice

## 2024-08-05 ENCOUNTER — Ambulatory Visit
Admission: RE | Admit: 2024-08-05 | Discharge: 2024-08-05 | Disposition: A | Discharge status: Home / Self Care | Attending: Gastroenterology | Admitting: Gastroenterology

## 2024-08-05 ENCOUNTER — Encounter: Payer: Self-pay | Admitting: Gastroenterology

## 2024-08-05 DIAGNOSIS — Z87891 Personal history of nicotine dependence: Secondary | ICD-10-CM | POA: Insufficient documentation

## 2024-08-05 DIAGNOSIS — F32A Depression, unspecified: Secondary | ICD-10-CM | POA: Diagnosis not present

## 2024-08-05 DIAGNOSIS — Z1211 Encounter for screening for malignant neoplasm of colon: Secondary | ICD-10-CM | POA: Diagnosis not present

## 2024-08-05 DIAGNOSIS — K640 First degree hemorrhoids: Secondary | ICD-10-CM | POA: Insufficient documentation

## 2024-08-05 DIAGNOSIS — Z83719 Family history of colon polyps, unspecified: Secondary | ICD-10-CM | POA: Insufficient documentation

## 2024-08-05 DIAGNOSIS — F419 Anxiety disorder, unspecified: Secondary | ICD-10-CM | POA: Diagnosis not present

## 2024-08-05 DIAGNOSIS — K5730 Diverticulosis of large intestine without perforation or abscess without bleeding: Secondary | ICD-10-CM | POA: Insufficient documentation

## 2024-08-05 HISTORY — PX: COLONOSCOPY: SHX5424

## 2024-08-05 SURGERY — COLONOSCOPY
Anesthesia: General

## 2024-08-05 MED ORDER — SODIUM CHLORIDE 0.9 % IV SOLN: INTRAVENOUS | Status: DC

## 2024-08-05 MED ORDER — LIDOCAINE HCL (PF) 2 % IJ SOLN
INTRAMUSCULAR | Status: DC | PRN
  Administered 2024-08-05: 40 mg via INTRADERMAL

## 2024-08-05 MED ORDER — PROPOFOL 10 MG/ML IV BOLUS
INTRAVENOUS | Status: DC | PRN
  Administered 2024-08-05 (×2): 20 mg via INTRAVENOUS
  Administered 2024-08-05: 50 mg via INTRAVENOUS
  Administered 2024-08-05 (×4): 20 mg via INTRAVENOUS
  Administered 2024-08-05: 10 mg via INTRAVENOUS
  Administered 2024-08-05: 20 mg via INTRAVENOUS

## 2024-08-05 NOTE — Op Note (Signed)
 [facility] Gastroenterology Patient Name: Linda Rogers Procedure Date: 08/05/2024 9:11 AM MRN: [id number] Account #: [id number] Date of Birth: 03-02-1976 Admit Type: Outpatient Age: 48 Room: [facility] ENDO ROOM 3 Gender: Female Note Status: Finalized Instrument Name: Arvis [id number] Procedure:             Colonoscopy Indications:           Colon cancer screening in patient at increased risk:                         Family history of 1st-degree relative with colon polyps Providers:             Ruel Kung MD, MD Medicines:             Monitored Anesthesia Care Complications:         No immediate complications. Procedure:             Pre-Anesthesia Assessment:                        - Prior to the procedure, a History and Physical was                         performed, and patient medications, allergies and                         sensitivities were reviewed. The patient's tolerance                         of previous anesthesia was reviewed.                        - The risks and benefits of the procedure and the                         sedation options and risks were discussed with the                         patient. All questions were answered and informed                         consent was obtained.                        - ASA Grade Assessment: II - A patient with mild                         systemic disease.                        After obtaining informed consent, the colonoscope was                         passed under direct vision. Throughout the procedure,                         the patient's blood pressure, pulse, and oxygen                         saturations were monitored continuously. The  Colonoscope was introduced through the anus and                         advanced to the the cecum, identified by the                         appendiceal orifice. The colonoscopy was performed                         with ease. The patient  tolerated the procedure well.                         The quality of the bowel preparation was excellent.                         The ileocecal valve, appendiceal orifice, and rectum                         were photographed. Findings:      The perianal and digital rectal examinations were normal.      Multiple medium-mouthed diverticula were found in the sigmoid colon.      Non-bleeding internal hemorrhoids were found during retroflexion. The       hemorrhoids were medium-sized and Grade I (internal hemorrhoids that do       not prolapse).      The exam was otherwise without abnormality on direct and retroflexion       views. Impression:            - Diverticulosis in the sigmoid colon.                        - Non-bleeding internal hemorrhoids.                        - The examination was otherwise normal on direct and                         retroflexion views.                        - No specimens collected. Recommendation:        - Discharge patient to home (with escort).                        - Resume previous diet.                        - Continue present medications.                        - Repeat colonoscopy in 10 years for screening                         purposes. Procedure Code(s):     --- Professional ---                        [id number], Colonoscopy, flexible; diagnostic, including                         collection of specimen(s) by brushing or  washing, when                         performed (separate procedure) Diagnosis Code(s):     --- Professional ---                        Z83.71, Family history of colonic polyps                        K57.30, Diverticulosis of large intestine without                         perforation or abscess without bleeding                        K64.0, First degree hemorrhoids CPT copyright 2022 American Medical Association. All rights reserved. The codes documented in this report are preliminary and upon coder review may  be revised to  meet current compliance requirements. Ruel Kung, MD Ruel Kung MD, MD 08/05/2024 9:41:21 AM This report has been signed electronically. Number of Addenda: 0 Note Initiated On: 08/05/2024 9:11 AM Scope Withdrawal Time: 0 hours 11 minutes 54 seconds  Total Procedure Duration: 0 hours 15 minutes 56 seconds  Estimated Blood Loss:  Estimated blood loss: none.      [facility]

## 2024-08-05 NOTE — Anesthesia Preprocedure Evaluation (Signed)
 Anesthesia Evaluation  Patient identified by MRN, date of birth, ID band Patient awake    Reviewed: Allergy & Precautions, NPO status , Patient's Chart, lab work & pertinent test results  Airway Mallampati: III  TM Distance: >3 FB Neck ROM: full    Dental  (+) Chipped   Pulmonary neg pulmonary ROS, former smoker   Pulmonary exam normal        Cardiovascular negative cardio ROS Normal cardiovascular exam     Neuro/Psych  PSYCHIATRIC DISORDERS Anxiety Depression    negative neurological ROS     GI/Hepatic negative GI ROS, Neg liver ROS,,,  Endo/Other  negative endocrine ROS    Renal/GU negative Renal ROS  negative genitourinary   Musculoskeletal   Abdominal   Peds  Hematology negative hematology ROS (+)   Anesthesia Other Findings Past Medical History: No date: Cancer (HCC)     Comment:  prev cancerous cells uterus  Past Surgical History: 2005: ABDOMINAL HYSTERECTOMY 1987: APPENDECTOMY No date: TONSILLECTOMY AND ADENOIDECTOMY  BMI    Body Mass Index: 36.81 kg/m      Reproductive/Obstetrics negative OB ROS                              Anesthesia Physical Anesthesia Plan  ASA: 2  Anesthesia Plan: General   Post-op Pain Management: Minimal or no pain anticipated   Induction: Intravenous  PONV Risk Score and Plan: 2 and Propofol  infusion and TIVA  Airway Management Planned: Nasal Cannula  Additional Equipment: None  Intra-op Plan:   Post-operative Plan:   Informed Consent: I have reviewed the patients History and Physical, chart, labs and discussed the procedure including the risks, benefits and alternatives for the proposed anesthesia with the patient or authorized representative who has indicated his/her understanding and acceptance.     Dental advisory given  Plan Discussed with: CRNA and Surgeon  Anesthesia Plan Comments: (Discussed risks of anesthesia with  patient, including possibility of difficulty with spontaneous ventilation under anesthesia necessitating airway intervention, PONV, and rare risks such as cardiac or respiratory or neurological events, and allergic reactions. Discussed the role of CRNA in patient's perioperative care. Patient understands.)        Anesthesia Quick Evaluation

## 2024-08-05 NOTE — Transfer of Care (Signed)
 Immediate Anesthesia Transfer of Care Note  Patient: Linda Rogers  Procedure(s) Performed: COLONOSCOPY  Patient Location: PACU  Anesthesia Type:MAC  Level of Consciousness: awake and alert   Airway & Oxygen Therapy: Patient Spontanous Breathing and Patient connected to nasal cannula oxygen  Post-op Assessment: Report given to RN and Post -op Vital signs reviewed and stable  Post vital signs: Reviewed and stable  Last Vitals:  Vitals Value Taken Time  BP 112/75 08/05/24 09:41  Temp 36.1 C 08/05/24 09:41  Pulse 76 08/05/24 09:49  Resp 22 08/05/24 09:49  SpO2 100 % 08/05/24 09:49  Vitals shown include unfiled device data.  Last Pain:  Vitals:   08/05/24 0941  TempSrc:   PainSc: Asleep         Complications: No notable events documented.

## 2024-08-05 NOTE — H&P (Signed)
 Ruel Kung , MD [address], [place], [place], [address] Phone: [phone] Fax: [phone]  Primary Care Physician:  Auston Reyes BIRCH, MD   Pre-Procedure History & Physical: HPI:  Linda Rogers is a 48 y.o. female is here for an colonoscopy.   Past Medical History:  Diagnosis Date   Cancer ([facility])    prev cancerous cells uterus    Past Surgical History:  Procedure Laterality Date   ABDOMINAL HYSTERECTOMY  2005   APPENDECTOMY  1987   TONSILLECTOMY AND ADENOIDECTOMY      Prior to Admission medications   Medication Sig Start Date End Date Taking? Authorizing Provider  amoxicillin -clavulanate (AUGMENTIN ) 875-125 MG tablet Take 1 tablet by mouth every 12 (twelve) hours. Patient not taking: Reported on 08/05/2024 03/02/23   Cook, Jayce G, DO  BIOTIN PO Take by mouth. Patient not taking: Reported on 08/05/2024    [provider]  cloNIDine (CATAPRES) 0.1 MG tablet Take by mouth. 02/26/23 02/26/24  [provider]  ELDERBERRY PO Take by mouth. Patient not taking: Reported on 08/05/2024    [provider]  fluticasone (FLONASE) 50 MCG/ACT nasal spray Place into both nostrils daily. Patient not taking: Reported on 08/05/2024    [provider]  hydrochlorothiazide (HYDRODIURIL) 25 MG tablet Take 1 tablet by mouth daily. 11/12/22 11/12/23  [provider]  ibuprofen (ADVIL,MOTRIN) 200 MG tablet Take by mouth as needed. Patient not taking: Reported on 08/05/2024    [provider]  levothyroxine  (SYNTHROID ) 112 MCG tablet Take 1 tablet (112 mcg total) by mouth daily before breakfast. 04/11/21 04/06/22  Just, Kelsea J, FNP  traZODone  (DESYREL ) 50 MG tablet Take 0.5-1 tablets (25-50 mg total) by mouth at bedtime as needed for sleep. Patient not taking: Reported on 08/05/2024 04/11/21   Just, Kelsea J, FNP   venlafaxine XR (EFFEXOR-XR) 150 MG 24 hr capsule Take 1 tablet by mouth daily. 02/11/23   [provider]    Allergies as of 07/15/2024   (No Known Allergies)    Family History  Problem Relation Age of Onset   Ovarian cancer Mother    Colon polyps Mother    Breast cancer Neg Hx     Social History   Socioeconomic History   Marital status: Married    Spouse name: Not on file   Number of children: Not on file   Years of education: Not on file   Highest education level: Not on file  Occupational History   Not on file  Tobacco Use   Smoking status: Former    Current packs/day: 0.00    Types: Cigarettes    Quit date: 11/16/2014    Years since quitting: 9.7   Smokeless tobacco: Never  Vaping Use   Vaping status: Every Day  Substance and Sexual Activity   Alcohol use: No    Alcohol/week: 0.0 standard drinks of alcohol   Drug use: No   Sexual activity: Not on file  Other Topics Concern   Not on file  Social History Narrative   Not on file   Social Drivers of Health   Financial Resource Strain: Low Risk  (06/24/2024)   Received from [facility]   Overall Financial Resource Strain (CARDIA)    Difficulty of Paying Living Expenses: Not very hard  Food Insecurity: No Food Insecurity (06/24/2024)   Received from [facility]   Hunger Vital Sign    Within the past 12 months, you worried that  your food would run out before you got the money to buy more.: Never true    Within the past 12 months, the food you bought just didn't last and you didn't have money to get more.: Never true  Transportation Needs: No Transportation Needs (06/24/2024)   Received from [facility] - Transportation    In the past 12 months, has lack of transportation kept you from medical appointments or from getting medications?: No    Lack of Transportation (Non-Medical): No  Physical Activity: Not on file  Stress: Not on file   Social Connections: Not on file  Intimate Partner Violence: Not on file    Review of Systems: See HPI, otherwise negative ROS  Physical Exam: BP (!) 146/96   Pulse (!) 101   Temp (!) 96.7 F (35.9 C) (Temporal)   Resp 16   Ht 6' 1 (1.854 m)   Wt 126.6 kg   SpO2 99%   BMI 36.81 kg/m  General:   Alert,  pleasant and cooperative in NAD Head:  Normocephalic and atraumatic. Neck:  Supple; no masses or thyromegaly. Lungs:  Clear throughout to auscultation, normal respiratory effort.    Heart:  +S1, +S2, Regular rate and rhythm, No edema. Abdomen:  Soft, nontender and nondistended. Normal bowel sounds, without guarding, and without rebound.   Neurologic:  Alert and  oriented x4;  grossly normal neurologically.  Impression/Plan: [name] is here for an colonoscopy to be performed for Screening colonoscopy average risk   Risks, benefits, limitations, and alternatives regarding  colonoscopy have been reviewed with the patient.  Questions have been answered.  All parties agreeable.   Ruel Kung, MD  08/05/2024, 9:09 AM

## 2024-08-06 NOTE — Anesthesia Postprocedure Evaluation (Signed)
 Anesthesia Post Note  Patient: [name]  Procedure(s) Performed: COLONOSCOPY  Patient location during evaluation: Endoscopy Anesthesia Type: General Level of consciousness: awake and alert Pain management: pain level controlled Vital Signs Assessment: post-procedure vital signs reviewed and stable Respiratory status: spontaneous breathing, nonlabored ventilation, respiratory function stable and patient connected to nasal cannula oxygen Cardiovascular status: blood pressure returned to baseline and stable Postop Assessment: no apparent nausea or vomiting Anesthetic complications: no   No notable events documented.   Last Vitals:  Vitals:   08/05/24 0941 08/05/24 0952  BP: 112/75 (!) 133/93  Pulse: 74 78  Resp: 12 20  Temp: (!) 36.1 C   SpO2: 100% 100%    Last Pain:  Vitals:   08/05/24 0952  TempSrc:   PainSc: 0-No pain                 [place]by [place]

## 2024-09-06 ENCOUNTER — Other Ambulatory Visit: Payer: Self-pay

## 2024-09-06 ENCOUNTER — Emergency Department
Admission: EM | Admit: 2024-09-06 | Discharge: 2024-09-06 | Disposition: A | Discharge status: Home / Self Care | Attending: Emergency Medicine | Admitting: Emergency Medicine

## 2024-09-06 ENCOUNTER — Emergency Department

## 2024-09-06 DIAGNOSIS — I83891 Varicose veins of right lower extremities with other complications: Secondary | ICD-10-CM | POA: Insufficient documentation

## 2024-09-06 DIAGNOSIS — M79604 Pain in right leg: Secondary | ICD-10-CM

## 2024-09-06 DIAGNOSIS — I82441 Acute embolism and thrombosis of right tibial vein: Secondary | ICD-10-CM | POA: Diagnosis not present

## 2024-09-06 DIAGNOSIS — M7989 Other specified soft tissue disorders: Secondary | ICD-10-CM | POA: Diagnosis present

## 2024-09-06 MED ORDER — APIXABAN 5 MG PO TABS: ORAL_TABLET | ORAL | 0 refills | Status: AC

## 2024-09-06 MED ORDER — APIXABAN 5 MG PO TABS
ORAL_TABLET | ORAL | 0 refills | Status: DC
Start: 2024-09-06 — End: 2024-09-06

## 2024-09-06 MED ORDER — APIXABAN 5 MG PO TABS
10.0000 mg | ORAL_TABLET | Freq: Once | ORAL | Status: AC
  Administered 2024-09-06: 10 mg via ORAL
  Filled 2024-09-06: qty 2

## 2024-09-06 NOTE — ED Provider Notes (Signed)
 [facility] Provider Note   CSN: [id number] Arrival date & time: 09/06/24  1329     Patient presents with: Leg Pain   Linda Rogers is a 48 y.o. female presents to the Emergency Department with left leg pain, swelling.  Patient recently returned to   from a flight from [place] on Thursday, 3 days ago.  2 days ago she developed some pain and swelling in the calf.  Has a history of varicose veins.  She has focal areas of pain and swelling along the varicose veins along the popliteal region of her right leg.  She denies any trauma or injury.  No history of blood clots.  She vapes but does not smoke cigarettes.  No history of clotting disorders.  She denies any chest pain shortness of breath or fevers.      Prior to Admission medications   Medication Sig Start Date End Date Taking? Authorizing Provider  amoxicillin -clavulanate (AUGMENTIN ) 875-125 MG tablet Take 1 tablet by mouth every 12 (twelve) hours. Patient not taking: Reported on 08/05/2024 03/02/23   Cook, Jayce G, DO  apixaban  (ELIQUIS ) 5 MG TABS tablet Take 2 tablets (10mg ) twice daily for 7 days, then 1 tablet (5mg ) twice daily 09/06/24   Charlene Debby BROCKS, PA-C  BIOTIN PO Take by mouth. Patient not taking: Reported on 08/05/2024    [provider]  cloNIDine (CATAPRES) 0.1 MG tablet Take by mouth. 02/26/23 02/26/24  [provider]  ELDERBERRY PO Take by mouth. Patient not taking: Reported on 08/05/2024    [provider]  fluticasone (FLONASE) 50 MCG/ACT nasal spray Place into both nostrils daily. Patient not taking: Reported on 08/05/2024    [provider]  hydrochlorothiazide (HYDRODIURIL) 25 MG tablet Take 1 tablet by mouth daily. 11/12/22 11/12/23  [provider]  ibuprofen (ADVIL,MOTRIN) 200 MG tablet Take by mouth as needed. Patient not taking: Reported on 08/05/2024    [provider]  levothyroxine  (SYNTHROID ) 112 MCG  tablet Take 1 tablet (112 mcg total) by mouth daily before breakfast. 04/11/21 04/06/22  Just, Kelsea J, FNP  traZODone  (DESYREL ) 50 MG tablet Take 0.5-1 tablets (25-50 mg total) by mouth at bedtime as needed for sleep. Patient not taking: Reported on 08/05/2024 04/11/21   Just, Kelsea J, FNP  venlafaxine XR (EFFEXOR-XR) 150 MG 24 hr capsule Take 1 tablet by mouth daily. 02/11/23   [provider]    Allergies: Patient has no known allergies.    Review of Systems  Updated Vital Signs BP (!) 155/98   Pulse 91   Temp 98.8 F (37.1 C) (Oral)   Resp 18   SpO2 100%   Physical Exam Constitutional:      Appearance: She is well-developed.  HENT:     Head: Normocephalic and atraumatic.  Eyes:     Conjunctiva/sclera: Conjunctivae normal.  Cardiovascular:     Rate and Rhythm: Normal rate.  Pulmonary:     Effort: Pulmonary effort is normal. No respiratory distress.  Musculoskeletal:        General: Normal range of motion.     Cervical back: Normal range of motion.     Comments: Negative Toula' sign bilaterally in the lower calfs but along the popliteal region she has some painful tender swollen areas x 2 mostly over her varicose veins.  She has 2+ dorsalis pedis pulses in the right lower extremity.  Ankle plantarflexion dorsiflexion is intact.  Skin:    General: Skin is warm.  Findings: No rash.  Neurological:     Mental Status: She is alert and oriented to person, place, and time.  Psychiatric:        Behavior: Behavior normal.        Thought Content: Thought content normal.     (all labs ordered are listed, but only abnormal results are displayed) Labs Reviewed - No data to display  EKG: None  Radiology: US  Venous Img Lower Unilateral Right Result Date: 09/06/2024 CLINICAL DATA:  Right lower extremity palpable, painful lumps. EXAM: RIGHT LOWER EXTREMITY VENOUS DOPPLER ULTRASOUND TECHNIQUE: Gray-scale sonography with graded compression, as well as color Doppler and  duplex ultrasound were performed to evaluate the lower extremity deep venous systems from the level of the common femoral vein and including the common femoral, femoral, profunda femoral, popliteal and calf veins including the posterior tibial, peroneal and gastrocnemius veins when visible. The superficial great saphenous vein was also interrogated. Spectral Doppler was utilized to evaluate flow at rest and with distal augmentation maneuvers in the common femoral, femoral and popliteal veins. COMPARISON:  None Available. FINDINGS: Contralateral Common Femoral Vein: Respiratory phasicity is normal and symmetric with the symptomatic side. No evidence of thrombus. Normal compressibility. Common Femoral Vein: No evidence of thrombus. Normal compressibility, respiratory phasicity and response to augmentation. Saphenofemoral Junction: No evidence of thrombus. Normal compressibility and flow on color Doppler imaging. Profunda Femoral Vein: No evidence of thrombus. Normal compressibility and flow on color Doppler imaging. Femoral Vein: No evidence of thrombus. Normal compressibility, respiratory phasicity and response to augmentation. Popliteal Vein: No evidence of thrombus. Normal compressibility, respiratory phasicity and response to augmentation. Calf Veins: No evidence of thrombus within the RIGHT peroneal vein. There is evidence of occlusive thrombus within the RIGHT posterior tibial vein with abnormal compressibility and flow on color Doppler imaging. Superficial Great Saphenous Vein: No evidence of thrombus. Normal compressibility. Venous Reflux:  None. Other Findings:  None. IMPRESSION: Occlusive thrombus within the RIGHT posterior tibial vein. Electronically Signed   By: Suzen Dials M.D.   On: 09/06/2024 15:25     Procedures   Medications Ordered in the ED  apixaban  (ELIQUIS ) tablet 10 mg (has no administration in time range)                                    Medical Decision  Making  48 year old female with right lower extremity DVT within the right posterior tibial vein.  She is started on Eliquis  and will follow-up with vein and vascular.  She has no chest pain shortness of breath.  She is not tachycardic.  She understands signs symptoms return to the ER for.  Final diagnoses:  Right leg pain  Right leg swelling  Symptomatic varicose veins, right  Acute deep vein thrombosis (DVT) of tibial vein of right lower extremity [facility])    ED Discharge Orders          Ordered    apixaban  (ELIQUIS ) 5 MG TABS tablet  Status:  Discontinued        09/06/24 1537    apixaban  (ELIQUIS ) 5 MG TABS tablet        09/06/24 1545               Charlene Debby JAYSON DEVONNA 09/06/24 1550    Levander Slate, MD 09/06/24 1732

## 2024-09-06 NOTE — ED Notes (Signed)
 PA, Chris at bedside.

## 2024-09-06 NOTE — ED Triage Notes (Incomplete)
 Pt to ED via POV from [facility]. Pt reports recent flight last week. Now has knots on her right leg. No hx of DVT. No blood thinners. Pt sent from [facility] for DVT rule out.

## 2024-09-06 NOTE — ED Notes (Signed)
 US  at bedside.

## 2024-09-06 NOTE — Discharge Instructions (Signed)
 Please start Eliquis , take as prescribed.  Follow-up with vein and vascular office for evaluation.  Return to the ER if you develop any chest pain shortness of breath worsening symptoms or any urgent changes in-year-old

## 2024-09-11 ENCOUNTER — Encounter (INDEPENDENT_AMBULATORY_CARE_PROVIDER_SITE_OTHER): Payer: Self-pay | Admitting: Vascular Surgery

## 2024-09-11 ENCOUNTER — Ambulatory Visit (INDEPENDENT_AMBULATORY_CARE_PROVIDER_SITE_OTHER): Payer: Self-pay | Admitting: Vascular Surgery

## 2024-09-11 VITALS — BP 139/85 | HR 80 | Ht 72.0 in | Wt 281.0 lb

## 2024-09-11 DIAGNOSIS — I82441 Acute embolism and thrombosis of right tibial vein: Secondary | ICD-10-CM

## 2024-09-11 DIAGNOSIS — I82409 Acute embolism and thrombosis of unspecified deep veins of unspecified lower extremity: Secondary | ICD-10-CM | POA: Insufficient documentation

## 2024-09-11 NOTE — Patient Instructions (Signed)
 Deep Vein Thrombosis  Deep vein thrombosis (DVT) is a condition in which a blood clot forms in a vein of the deep venous system. This can occur in the lower leg, thigh, pelvis, arm, or neck. A clot is blood that has thickened into a gel or solid. This condition is serious and can be life-threatening if the clot travels to the arteries of the lungs and causes a blockage (pulmonary embolism). A DVT can also damage veins in the leg, which can lead to long-term venous disease, leg pain, swelling, discoloration, and ulcers or sores (post-thrombotic syndrome). What are the causes? This condition may be caused by: A slowdown of blood flow. Damage to a vein. A condition that causes blood to clot more easily, such as certain bleeding disorders. What increases the risk? The following factors may make you more likely to develop this condition: Obesity. Being older, especially older than age 15. Being inactive or not moving around (sedentary lifestyle). This may include: Sitting or lying down for longer than 4-6 hours other than to sleep at night. Being in the hospital, or having major or lengthy surgery. Having any recent bone injuries, such as breaks (fractures), that reduce movement, especially in the lower extremities. Having recent orthopedic surgery on the lower extremities. Being pregnant, giving birth, or having recently given birth. Taking medicines that contain estrogen, such as birth control or hormone replacement therapy. Using products that contain nicotine or tobacco, especially if you use hormonal birth control. Having a history of a blood vessel disease (peripheral vascular disease) or congestive heart disease. Having a history of cancer, especially if being treated with chemotherapy. What are the signs or symptoms? Symptoms of this condition include: Swelling, pain, pressure, or tenderness in an arm or a leg. An arm or a leg becoming warm, red, or discolored. A leg turning very pale or  blue. You may have a large DVT. This is rare. If the clot is in your leg, you may notice that symptoms get worse when you stand or walk. In some cases, there are no symptoms. How is this diagnosed? This condition is diagnosed with: Your medical history and a physical exam. Tests, such as: Blood tests to check how well your blood clots. Doppler ultrasound. This is the best way to find a DVT. CT venogram. Contrast dye is injected into a vein, and X-rays are taken to check for clots. This is helpful for veins in the chest or pelvis. How is this treated? Treatment for this condition depends on: The cause of your DVT. The size and location of your DVT, or having more than one DVT. Your risk for bleeding or developing more clots. Other medical conditions you may have. Treatment may include: Taking a blood thinner medicine (anticoagulant) to prevent more clots from forming or current clots from growing. Wearing compression stockings. Injecting medicines into the affected vein to break up the clot (catheter-directed thrombolysis). Surgical procedures, when DVT is severe or hard to treat. These may be done to: Isolate and remove your clot. Place an inferior vena cava (IVC) filter. This filter is placed into a large vein called the inferior vena cava to catch blood clots before they reach your lungs. You may get some medical treatments for 6 months or longer. Follow these instructions at home: If you are taking blood thinners: Talk with your health care provider before you take any medicines that contain aspirin or NSAIDs, such as ibuprofen. These medicines increase your risk for dangerous bleeding. Take your medicine exactly  as told, at the same time every day. Do not skip a dose. Do not take more than the prescribed dose. This is important. Ask your health care provider about foods and medicines that could change or interact with the way your blood thinner works. Avoid these foods and medicines  if you are told to do so. Avoid anything that may cause bleeding or bruising. You may bleed more easily while taking blood thinners. Be very careful when using knives, scissors, or other sharp objects. Use an electric razor instead of a blade. Avoid activities that could cause injury or bruising, and follow instructions for preventing falls. Tell your health care provider if you have had any internal bleeding, bleeding ulcers, or neurologic diseases, such as strokes or cerebral aneurysms. Wear a medical alert bracelet or carry a card that lists what medicines you take. General instructions Take over-the-counter and prescription medicines only as told by your health care provider. Return to your normal activities as told by your health care provider. Ask your health care provider what activities are safe for you. If recommended, wear compression stockings as told by your health care provider. These stockings help to prevent blood clots and reduce swelling in your legs. Never wear your compression stockings while sleeping at night. Keep all follow-up visits. This is important. Where to find more information American Heart Association: www.heart.org Centers for Disease Control and Prevention: FootballExhibition.com.br National Heart, Lung, and Blood Institute: PopSteam.is Contact a health care provider if: You miss a dose of your blood thinner. You have unusual bruising or other color changes. You have new or worse pain, swelling, or redness in an arm or a leg. You have worsening numbness or tingling in an arm or a leg. You have a significant color change (pale or blue) in the extremity that has the DVT. Get help right away if: You have signs or symptoms that a blood clot has moved to the lungs. These may include: Shortness of breath. Chest pain. Fast or irregular heartbeats (palpitations). Light-headedness, dizziness, or fainting. Coughing up blood. You have signs or symptoms that your blood is  too thin. These may include: Blood in your vomit, stool, or urine. A cut that will not stop bleeding. A menstrual period that is heavier than usual. A severe headache or confusion. These symptoms may be an emergency. Get help right away. Call 911. Do not wait to see if the symptoms will go away. Do not drive yourself to the hospital. Summary Deep vein thrombosis (DVT) happens when a blood clot forms in a deep vein. This may occur in the lower leg, thigh, pelvis, arm, or neck. Symptoms affect the arm or leg and can include swelling, pain, tenderness, warmth, redness, or discoloration. This condition may be treated with medicines. In severe cases, a procedure or surgery may be done to remove or dissolve the clots. If you are taking blood thinners, take them exactly as told. Do not skip a dose. Do not take more than is prescribed. Get help right away if you have a severe headache, shortness of breath, chest pain, fast or irregular heartbeats, or blood in your vomit, urine, or stool. This information is not intended to replace advice given to you by your health care provider. Make sure you discuss any questions you have with your health care provider. Document Revised: 07/10/2021 Document Reviewed: 07/10/2021 Elsevier Patient Education  2024 [employer].

## 2024-09-11 NOTE — Progress Notes (Signed)
 Patient ID: Linda Rogers, female   DOB: [birth year], 48 y.o.   MRN: [id number]  Chief Complaint  Patient presents with   New Patient (Initial Visit)     [facility] ER RT Leg pain- DVT done 09/06/24. call (Vascular Su    HPI Linda Rogers is a 48 y.o. female.  I am asked to see the patient by Dr. Auston for evaluation of right tibial vein DVT.  The patient had a long flight to [facility] prior to developing pain and swelling in the right calf and posterior knee area.  This was new to her.  She has some chronic mild swelling and varicose veins but nothing that ever been particularly bothersome.  She did not wear her compression socks as she typically does with long flights.  On return, earlier this week her symptoms prompted a venous duplex which demonstrated right tibial vein DVT.  No proximal DVT was identified.  She was having a lot of pain and swelling and was appropriately started on Eliquis .  She has had no bleeding issues or problems since she has started this.     Past Medical History:  Diagnosis Date   Cancer (HCC)    prev cancerous cells uterus    Past Surgical History:  Procedure Laterality Date   ABDOMINAL HYSTERECTOMY  2005   APPENDECTOMY  1987   COLONOSCOPY N/A 08/05/2024   Procedure: COLONOSCOPY;  Surgeon: Therisa Bi, MD;  Location: [facility] ENDOSCOPY;  Service: Gastroenterology;  Laterality: N/A;   TONSILLECTOMY AND ADENOIDECTOMY       Family History  Problem Relation Age of Onset   Ovarian cancer Mother    Colon polyps Mother    Breast cancer Neg Hx      Social History   Tobacco Use   Smoking status: Former    Current packs/day: 0.00    Types: Cigarettes    Quit date: 11/16/2014    Years since quitting: 9.8   Smokeless tobacco: Never  Vaping Use   Vaping status: Every Day  Substance Use Topics   Alcohol use: No    Alcohol/week: 0.0 standard drinks of alcohol   Drug use: No     No Known Allergies  Current Outpatient Medications  Medication Sig  Dispense Refill   apixaban  (ELIQUIS ) 5 MG TABS tablet Take 2 tablets (10mg ) twice daily for 7 days, then 1 tablet (5mg ) twice daily 60 tablet 0   ELDERBERRY PO Take by mouth.     hydrochlorothiazide (HYDRODIURIL) 25 MG tablet Take 1 tablet by mouth daily.     traZODone  (DESYREL ) 50 MG tablet Take 0.5-1 tablets (25-50 mg total) by mouth at bedtime as needed for sleep. 30 tablet 3   venlafaxine XR (EFFEXOR-XR) 150 MG 24 hr capsule Take 1 tablet by mouth daily.     amoxicillin -clavulanate (AUGMENTIN ) 875-125 MG tablet Take 1 tablet by mouth every 12 (twelve) hours. (Patient not taking: Reported on 08/05/2024) 14 tablet 0   cloNIDine (CATAPRES) 0.1 MG tablet Take by mouth.     fluticasone (FLONASE) 50 MCG/ACT nasal spray Place into both nostrils daily. (Patient not taking: Reported on 09/11/2024)     ibuprofen (ADVIL,MOTRIN) 200 MG tablet Take by mouth as needed. (Patient not taking: Reported on 09/11/2024)     levothyroxine  (SYNTHROID ) 112 MCG tablet Take 1 tablet (112 mcg total) by mouth daily before breakfast. 90 tablet 3   No current facility-administered medications for this visit.      REVIEW OF SYSTEMS (Negative unless checked)  Constitutional: [] Weight loss  [] Fever  [] Chills Cardiac: [] Chest pain   [] Chest pressure   [] Palpitations   [] Shortness of breath when laying flat   [] Shortness of breath at rest   [] Shortness of breath with exertion. Vascular:  [x] Pain in legs with walking   [x] Pain in legs at rest   [] Pain in legs when laying flat   [] Claudication   [] Pain in feet when walking  [] Pain in feet at rest  [] Pain in feet when laying flat   [x] History of DVT   [x] Phlebitis   [x] Swelling in legs   [] Varicose veins   [] Non-healing ulcers Pulmonary:   [] Uses home oxygen   [] Productive cough   [] Hemoptysis   [] Wheeze  [] COPD   [] Asthma Neurologic:  [] Dizziness  [] Blackouts   [] Seizures   [] History of stroke   [] History of TIA  [] Aphasia   [] Temporary blindness   [] Dysphagia   [] Weakness or  numbness in arms   [] Weakness or numbness in legs Musculoskeletal:  [] Arthritis   [] Joint swelling   [] Joint pain   [] Low back pain Hematologic:  [] Easy bruising  [] Easy bleeding   [] Hypercoagulable state   [] Anemic  [] Hepatitis Gastrointestinal:  [] Blood in stool   [] Vomiting blood  [] Gastroesophageal reflux/heartburn   [] Abdominal pain Genitourinary:  [] Chronic kidney disease   [] Difficult urination  [] Frequent urination  [] Burning with urination   [] Hematuria Skin:  [] Rashes   [] Ulcers   [] Wounds Psychological:  [] History of anxiety   []  History of major depression.    Physical Exam BP 139/85   Pulse 80   Ht 6' (1.829 m)   Wt 281 lb (127.5 kg)   BMI 38.11 kg/m  Gen:  WD/WN, NAD Head: [place]/AT, No temporalis wasting.  Ear/Nose/Throat: Hearing grossly intact, nares w/o erythema or drainage, oropharynx w/o Erythema/Exudate Eyes: Conjunctiva clear, sclera non-icteric  Neck: trachea midline.  No JVD.  Pulmonary:  Good air movement, respirations not labored, no use of accessory muscles  Cardiac: RRR, no JVD Vascular:  Vessel Right Left  Radial Palpable Palpable                          PT    DP     Gastrointestinal:. No masses, surgical incisions, or scars. Musculoskeletal: M/S 5/5 throughout.  Extremities without ischemic changes.  No deformity or atrophy.  Right calf and posterior knee area are quite tender to palpation.  1+ right lower extremity edema, trace left lower extremity edema.  Diffuse varicosities present bilaterally. Neurologic: Sensation grossly intact in extremities.  Symmetrical.  Speech is fluent. Motor exam as listed above. Psychiatric: Judgment intact, Mood & affect appropriate for pt's clinical situation. Dermatologic: No rashes or ulcers noted.  No cellulitis or open wounds.    Radiology US  Venous Img Lower Unilateral Right Result Date: 09/06/2024 CLINICAL DATA:  Right lower extremity palpable, painful lumps. EXAM: RIGHT LOWER EXTREMITY VENOUS DOPPLER  ULTRASOUND TECHNIQUE: Gray-scale sonography with graded compression, as well as color Doppler and duplex ultrasound were performed to evaluate the lower extremity deep venous systems from the level of the common femoral vein and including the common femoral, femoral, profunda femoral, popliteal and calf veins including the posterior tibial, peroneal and gastrocnemius veins when visible. The superficial great saphenous vein was also interrogated. Spectral Doppler was utilized to evaluate flow at rest and with distal augmentation maneuvers in the common femoral, femoral and popliteal veins. COMPARISON:  None Available. FINDINGS: Contralateral Common Femoral Vein: Respiratory phasicity is normal and symmetric  with the symptomatic side. No evidence of thrombus. Normal compressibility. Common Femoral Vein: No evidence of thrombus. Normal compressibility, respiratory phasicity and response to augmentation. Saphenofemoral Junction: No evidence of thrombus. Normal compressibility and flow on color Doppler imaging. Profunda Femoral Vein: No evidence of thrombus. Normal compressibility and flow on color Doppler imaging. Femoral Vein: No evidence of thrombus. Normal compressibility, respiratory phasicity and response to augmentation. Popliteal Vein: No evidence of thrombus. Normal compressibility, respiratory phasicity and response to augmentation. Calf Veins: No evidence of thrombus within the RIGHT peroneal vein. There is evidence of occlusive thrombus within the RIGHT posterior tibial vein with abnormal compressibility and flow on color Doppler imaging. Superficial Great Saphenous Vein: No evidence of thrombus. Normal compressibility. Venous Reflux:  None. Other Findings:  None. IMPRESSION: Occlusive thrombus within the RIGHT posterior tibial vein. Electronically Signed   By: Suzen Dials M.D.   On: 09/06/2024 15:25    Labs No results found for this or any previous visit (from the past 2160  hours).  Assessment/Plan:  DVT (deep venous thrombosis) (HCC) The patient has a right tibial vein DVT which is significantly symptomatic.  I would agree with anticoagulation for roughly 4 to 6 weeks and then reassess the DVT with duplex.  If it has resolved or become chronic at that point, anticoagulation can be stopped and we can go to a baby aspirin a day.  She has an upcoming trip in several months and I told her that I will be of heightened importance to wear her compression socks, walk frequently, and at a minimum take a baby aspirin with the flight depending on what we find on her follow-up duplex.  I will see her back in about a month.      Selinda Gu 09/11/2024, 12:53 PM   This note was created with Dragon medical transcription system.  Any errors from dictation are unintentional.

## 2024-09-11 NOTE — Assessment & Plan Note (Signed)
 The patient has a right tibial vein DVT which is significantly symptomatic.  I would agree with anticoagulation for roughly 4 to 6 weeks and then reassess the DVT with duplex.  If it has resolved or become chronic at that point, anticoagulation can be stopped and we can go to a baby aspirin a day.  She has an upcoming trip in several months and I told her that I will be of heightened importance to wear her compression socks, walk frequently, and at a minimum take a baby aspirin with the flight depending on what we find on her follow-up duplex.  I will see her back in about a month.

## 2024-10-12 ENCOUNTER — Other Ambulatory Visit (INDEPENDENT_AMBULATORY_CARE_PROVIDER_SITE_OTHER): Payer: Self-pay | Admitting: Vascular Surgery

## 2024-10-12 DIAGNOSIS — I82461 Acute embolism and thrombosis of right calf muscular vein: Secondary | ICD-10-CM

## 2024-10-13 ENCOUNTER — Ambulatory Visit (INDEPENDENT_AMBULATORY_CARE_PROVIDER_SITE_OTHER): Admitting: Vascular Surgery

## 2024-10-13 ENCOUNTER — Other Ambulatory Visit (INDEPENDENT_AMBULATORY_CARE_PROVIDER_SITE_OTHER)

## 2024-10-13 ENCOUNTER — Encounter (INDEPENDENT_AMBULATORY_CARE_PROVIDER_SITE_OTHER): Payer: Self-pay | Admitting: Vascular Surgery

## 2024-10-13 VITALS — BP 127/79 | HR 75 | Resp 16 | Ht 72.0 in | Wt 285.2 lb

## 2024-10-13 DIAGNOSIS — I82441 Acute embolism and thrombosis of right tibial vein: Secondary | ICD-10-CM | POA: Diagnosis not present

## 2024-10-13 DIAGNOSIS — I82461 Acute embolism and thrombosis of right calf muscular vein: Secondary | ICD-10-CM | POA: Diagnosis not present

## 2024-10-13 NOTE — Progress Notes (Signed)
 MRN : [id number]  Linda Rogers is a 48 y.o. (1976/02/19) female who presents with chief complaint of  Chief Complaint  Patient presents with   Follow-up    1 month + R Leg DVT  .  History of Present Illness: Patient returns today in follow up of her right tibial vein DVT.  She is doing well.  She has no new chest pain or shortness of breath.  She still has some mild right calf and knee pain but overall this is improved.  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  Current Outpatient Medications  Medication Sig Dispense Refill   acetaminophen (TYLENOL) 325 MG tablet Take 650 mg by mouth every 6 (six) hours as needed.     apixaban  (ELIQUIS ) 5 MG TABS tablet Take 2 tablets (10mg ) twice daily for 7 days, then 1 tablet (5mg ) twice daily 60 tablet 0   ELDERBERRY PO Take by mouth.     hydrochlorothiazide (HYDRODIURIL) 25 MG tablet Take 1 tablet by mouth daily.     levothyroxine  (SYNTHROID ) 112 MCG tablet Take 1 tablet (112 mcg total) by mouth daily before breakfast. 90 tablet 3   traZODone  (DESYREL ) 50 MG tablet Take 0.5-1 tablets (25-50 mg total) by mouth at bedtime as needed for sleep. 30 tablet 3   venlafaxine XR (EFFEXOR-XR) 150 MG 24 hr capsule Take 1 tablet by mouth daily.     amoxicillin -clavulanate (AUGMENTIN ) 875-125 MG tablet Take 1 tablet by mouth every 12 (twelve) hours. (Patient not taking: Reported on 08/05/2024) 14 tablet 0   cloNIDine (CATAPRES) 0.1 MG tablet Take by mouth. (Patient not taking: Reported on 10/13/2024)     fluticasone (FLONASE) 50 MCG/ACT nasal spray Place into both nostrils daily. (Patient not taking: Reported on 10/13/2024)     ibuprofen (ADVIL,MOTRIN) 200 MG tablet Take by mouth as needed. (Patient not taking: Reported on 10/13/2024)     No current facility-administered medications for this visit.    Past Medical History:  Diagnosis Date   Cancer (HCC)    prev cancerous cells uterus    Past Surgical History:  Procedure  Laterality Date   ABDOMINAL HYSTERECTOMY  2005   APPENDECTOMY  1987   COLONOSCOPY N/A 08/05/2024   Procedure: COLONOSCOPY;  Surgeon: Therisa Bi, MD;  Location: [facility] ENDOSCOPY;  Service: Gastroenterology;  Laterality: N/A;   TONSILLECTOMY AND ADENOIDECTOMY       Social History   Tobacco Use   Smoking status: Former    Current packs/day: 0.00    Types: Cigarettes    Quit date: 11/16/2014    Years since quitting: 9.9   Smokeless tobacco: Never  Vaping Use   Vaping status: Every Day  Substance Use Topics   Alcohol use: No    Alcohol/week: 0.0 standard drinks of alcohol   Drug use: No      Family History  Problem Relation Age of Onset   Ovarian cancer Mother    Colon polyps Mother    Breast cancer Neg Hx      No Known Allergies       REVIEW OF SYSTEMS (Negative unless checked)   Constitutional: [] Weight loss  [] Fever  [] Chills Cardiac: [] Chest pain   [] Chest pressure   [] Palpitations   [] Shortness of breath when laying flat   [] Shortness of breath at rest   [] Shortness of breath with exertion. Vascular:  [x] Pain in legs with walking   [x] Pain in legs at rest   [] Pain in legs when laying flat   []   Claudication   [] Pain in feet when walking  [] Pain in feet at rest  [] Pain in feet when laying flat   [x] History of DVT   [x] Phlebitis   [x] Swelling in legs   [] Varicose veins   [] Non-healing ulcers Pulmonary:   [] Uses home oxygen   [] Productive cough   [] Hemoptysis   [] Wheeze  [] COPD   [] Asthma Neurologic:  [] Dizziness  [] Blackouts   [] Seizures   [] History of stroke   [] History of TIA  [] Aphasia   [] Temporary blindness   [] Dysphagia   [] Weakness or numbness in arms   [] Weakness or numbness in legs Musculoskeletal:  [] Arthritis   [] Joint swelling   [] Joint pain   [] Low back pain Hematologic:  [] Easy bruising  [] Easy bleeding   [] Hypercoagulable state   [] Anemic  [] Hepatitis Gastrointestinal:  [] Blood in stool   [] Vomiting blood  [] Gastroesophageal reflux/heartburn   [] Abdominal  pain Genitourinary:  [] Chronic kidney disease   [] Difficult urination  [] Frequent urination  [] Burning with urination   [] Hematuria Skin:  [] Rashes   [] Ulcers   [] Wounds Psychological:  [] History of anxiety   []  History of major depression.  Physical Examination  BP 127/79   Pulse 75   Resp 16   Ht 6' (1.829 m)   Wt 285 lb 3.2 oz (129.4 kg)   BMI 38.68 kg/m  Gen:  WD/WN, NAD Head: [place]/AT, No temporalis wasting. Ear/Nose/Throat: Hearing grossly intact, nares w/o erythema or drainage Eyes: Conjunctiva clear. Sclera non-icteric Neck: Supple.  Trachea midline Pulmonary:  Good air movement, no use of accessory muscles.  Cardiac: RRR, no JVD Vascular:  Vessel Right Left  Radial Palpable Palpable                          PT Palpable Palpable  DP Palpable Palpable   Gastrointestinal: soft, non-tender/non-distended. No guarding/reflex.  Musculoskeletal: M/S 5/5 throughout.  No deformity or atrophy.  No appreciable lower extremity edema. Neurologic: Sensation grossly intact in extremities.  Symmetrical.  Speech is fluent.  Psychiatric: Judgment intact, Mood & affect appropriate for pt's clinical situation. Dermatologic: No rashes or ulcers noted.  No cellulitis or open wounds.      Labs No results found for this or any previous visit (from the past 2160 hours).  Radiology No results found.  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  She has been on full anticoagulation and with her resolution for this tibial vein DVT we can now stop that.  I would do an 81 mg aspirin daily going forward.  She has a trip coming up in several months and I have urged her to wear her compression socks continue the aspirin at least through that time.  She will contact my office with any worsening symptoms.    Selinda Gu, MD  10/13/2024 11:13 AM    This note was created with Dragon medical transcription system.  Any  errors from dictation are purely unintentional

## 2024-10-13 NOTE — Assessment & Plan Note (Signed)
 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  She has been on full anticoagulation and with her resolution for this tibial vein DVT we can now stop that.  I would do an 81 mg aspirin daily going forward.  She has a trip coming up in several months and I have urged her to wear her compression socks continue the aspirin at least through that time.  She will contact my office with any worsening symptoms.
